# Patient Record
Sex: Male | Born: 1970 | Race: White | Hispanic: No | Marital: Single | Smoking: Current every day smoker
Health system: Southern US, Community
[De-identification: ages and names within clinical notes are randomized; demographics above are authoritative.]

## PROBLEM LIST (undated history)

## (undated) DIAGNOSIS — B20 Human immunodeficiency virus [HIV] disease: Secondary | ICD-10-CM

## (undated) DIAGNOSIS — Z720 Tobacco use: Secondary | ICD-10-CM

## (undated) DIAGNOSIS — F319 Bipolar disorder, unspecified: Secondary | ICD-10-CM

## (undated) DIAGNOSIS — F209 Schizophrenia, unspecified: Secondary | ICD-10-CM

---

## 2018-09-10 ENCOUNTER — Encounter: Payer: Self-pay | Admitting: Intensive Care

## 2018-09-10 ENCOUNTER — Emergency Department
Admission: EM | Admit: 2018-09-10 | Discharge: 2018-09-11 | Disposition: A | Discharge status: Other Acute Inpatient Hospital | Payer: Medicare Other | Attending: Student in an Organized Health Care Education/Training Program | Admitting: Student in an Organized Health Care Education/Training Program

## 2018-09-10 ENCOUNTER — Other Ambulatory Visit: Payer: Self-pay

## 2018-09-10 ENCOUNTER — Emergency Department: Payer: Medicare Other

## 2018-09-10 DIAGNOSIS — Z21 Asymptomatic human immunodeficiency virus [HIV] infection status: Secondary | ICD-10-CM | POA: Insufficient documentation

## 2018-09-10 DIAGNOSIS — Y999 Unspecified external cause status: Secondary | ICD-10-CM | POA: Insufficient documentation

## 2018-09-10 DIAGNOSIS — X58XXXA Exposure to other specified factors, initial encounter: Secondary | ICD-10-CM | POA: Insufficient documentation

## 2018-09-10 DIAGNOSIS — S90414A Abrasion, right lesser toe(s), initial encounter: Secondary | ICD-10-CM | POA: Insufficient documentation

## 2018-09-10 DIAGNOSIS — F17210 Nicotine dependence, cigarettes, uncomplicated: Secondary | ICD-10-CM | POA: Insufficient documentation

## 2018-09-10 DIAGNOSIS — Z046 Encounter for general psychiatric examination, requested by authority: Secondary | ICD-10-CM | POA: Insufficient documentation

## 2018-09-10 DIAGNOSIS — F319 Bipolar disorder, unspecified: Secondary | ICD-10-CM | POA: Insufficient documentation

## 2018-09-10 DIAGNOSIS — Z1159 Encounter for screening for other viral diseases: Secondary | ICD-10-CM | POA: Insufficient documentation

## 2018-09-10 DIAGNOSIS — R45851 Suicidal ideations: Secondary | ICD-10-CM

## 2018-09-10 DIAGNOSIS — Y929 Unspecified place or not applicable: Secondary | ICD-10-CM | POA: Insufficient documentation

## 2018-09-10 DIAGNOSIS — M545 Low back pain: Secondary | ICD-10-CM | POA: Insufficient documentation

## 2018-09-10 DIAGNOSIS — Y9389 Activity, other specified: Secondary | ICD-10-CM | POA: Insufficient documentation

## 2018-09-10 DIAGNOSIS — Z590 Homelessness: Secondary | ICD-10-CM | POA: Insufficient documentation

## 2018-09-10 HISTORY — DX: Tobacco use: Z72.0

## 2018-09-10 HISTORY — DX: Human immunodeficiency virus (HIV) disease: B20

## 2018-09-10 HISTORY — DX: Bipolar disorder, unspecified: F31.9

## 2018-09-10 HISTORY — DX: Schizophrenia, unspecified: F20.9

## 2018-09-10 LAB — COMPREHENSIVE METABOLIC PANEL
ALT: 14 U/L (ref 0–44)
AST: 17 U/L (ref 15–41)
Albumin: 4.3 g/dL (ref 3.5–5.0)
Alkaline Phosphatase: 74 U/L (ref 38–126)
Anion gap: 6 (ref 5–15)
BUN: 17 mg/dL (ref 6–20)
CO2: 26 mmol/L (ref 22–32)
Calcium: 9.6 mg/dL (ref 8.9–10.3)
Chloride: 107 mmol/L (ref 98–111)
Creatinine, Ser: 0.64 mg/dL (ref 0.61–1.24)
GFR calc Af Amer: >60 mL/min (ref >60)
GFR calc non Af Amer: >60 mL/min (ref >60)
Glucose, Bld: 87 mg/dL (ref 70–99)
Potassium: 3.9 mmol/L (ref 3.5–5.1)
Sodium: 139 mmol/L (ref 135–145)
Total Bilirubin: 0.5 mg/dL (ref 0.3–1.2)
Total Protein: 7.5 g/dL (ref 6.5–8.1)

## 2018-09-10 LAB — URINE DRUG SCREEN, QUALITATIVE (ARMC ONLY)
Amphetamines, Ur Screen: NOT DETECTED
Barbiturates, Ur Screen: NOT DETECTED
Benzodiazepine, Ur Scrn: NOT DETECTED
Cannabinoid 50 Ng, Ur ~~LOC~~: NOT DETECTED
Cocaine Metabolite,Ur ~~LOC~~: NOT DETECTED
MDMA (Ecstasy)Ur Screen: NOT DETECTED
Methadone Scn, Ur: NOT DETECTED
Opiate, Ur Screen: NOT DETECTED
Phencyclidine (PCP) Ur S: NOT DETECTED
Tricyclic, Ur Screen: NOT DETECTED

## 2018-09-10 LAB — CBC
HCT: 49.2 % (ref 39.0–52.0)
Hemoglobin: 16 g/dL (ref 13.0–17.0)
MCH: 30.9 pg (ref 26.0–34.0)
MCHC: 32.5 g/dL (ref 30.0–36.0)
MCV: 95 fL (ref 80.0–100.0)
Platelets: 179 10*3/uL (ref 150–400)
RBC: 5.18 MIL/uL (ref 4.22–5.81)
RDW: 12.9 % (ref 11.5–15.5)
WBC: 8.4 10*3/uL (ref 4.0–10.5)
nRBC: 0 % (ref 0.0–0.2)

## 2018-09-10 LAB — ETHANOL: Alcohol, Ethyl (B): <10 mg/dL (ref <10)

## 2018-09-10 LAB — SALICYLATE LEVEL: Salicylate Lvl: <7 mg/dL (ref 2.8–30.0)

## 2018-09-10 LAB — ACETAMINOPHEN LEVEL: Acetaminophen (Tylenol), Serum: <10 ug/mL — ABNORMAL LOW (ref 10–30)

## 2018-09-10 LAB — LITHIUM LEVEL: Lithium Lvl: <0.06 mmol/L — ABNORMAL LOW (ref 0.60–1.20)

## 2018-09-10 NOTE — ED Notes (Signed)
Pt. Sleeping in room at this time.

## 2018-09-10 NOTE — ED Notes (Signed)
Patients thumb nail on his left hand is overgrown and looks infected, his second toe on his right foot has a small abrasion on toe, patients says it causes him pain. Patient is in NAD and is appropriate and cooperative

## 2018-09-10 NOTE — ED Provider Notes (Signed)
[facility] Emergency Department Provider Note    First MD Initiated Contact with Patient 09/10/18 1715     (approximate)  I have reviewed the triage vital signs and the nursing notes.   HISTORY  Chief Complaint Suicidal; Toe Pain; and Back Pain    HPI Miguel Henry is a 48 y.o. male below listed past medical history presents the ER for evaluation of suicidal ideation with plan to slit his wrists.  States he has been feeling this way for the past several days and is thinking about the last time that he tried to overdose on aspirin when he was a child.  States he is also complaining of right second toe pain and low back pain but this is been going on for quite some time.  Denies any fevers.  States he currently is homeless.    Past Medical History:  Diagnosis Date  . Bipolar 1 disorder (HCC)   . HIV (human immunodeficiency virus infection) (HCC)   . Schizophrenic disorder (HCC)   . Tobacco abuse    History reviewed. No pertinent family history. History reviewed. No pertinent surgical history. There are no active problems to display for this patient.     Prior to Admission medications   Not on File    Allergies Patient has no known allergies.    Social History Social History   Tobacco Use  . Smoking status: Current Every Day Smoker    Types: Cigarettes  . Smokeless tobacco: Former Engineer, water Use Topics  . Alcohol use: Yes    Comment: when able to get some  . Drug use: Not Currently    Types: Marijuana, Cocaine    Review of Systems Patient denies headaches, rhinorrhea, blurry vision, numbness, shortness of breath, chest pain, edema, cough, abdominal pain, nausea, vomiting, diarrhea, dysuria, fevers, rashes or hallucinations unless otherwise stated above in HPI. ____________________________________________   PHYSICAL EXAM:  VITAL SIGNS: Vitals:   09/10/18 1624  BP: 110/67  Pulse: 76  Resp: 18  Temp: 99.2 F (37.3 C)   SpO2: 99%    Constitutional: Alert and oriented. disheveled Eyes: Conjunctivae are normal.  Head: Atraumatic. Nose: No congestion/rhinnorhea. Mouth/Throat: Mucous membranes are moist.   Neck: No stridor. Painless ROM.  Cardiovascular: Normal rate, regular rhythm. Grossly normal heart sounds.  Good peripheral circulation. Respiratory: Normal respiratory effort.  No retractions. Lungs CTAB. Gastrointestinal: Soft and nontender. No distention. No abdominal bruits. No CVA tenderness. Genitourinary:  Musculoskeletal:  bunion of right great toe,  No erythema, effusion, cellulitis. No lower extremity tenderness nor edema.  No joint effusions. Neurologic:  Normal speech and language. No gross focal neurologic deficits are appreciated. No facial droop Skin:  Skin is warm, dry and intact. No rash noted. Psychiatric: Mood and affect are anxious appearing. Speech and behavior are normal.  ____________________________________________   LABS (all labs ordered are listed, but only abnormal results are displayed)  Results for orders placed or performed during the hospital encounter of 09/10/18 (from the past 24 hour(s))  cbc     Status: None   Collection Time: 09/10/18  5:17 PM  Result Value Ref Range   WBC 8.4 4.0 - 10.5 K/uL   RBC 5.18 4.22 - 5.81 MIL/uL   Hemoglobin 16.0 13.0 - 17.0 g/dL   HCT 25.0 03.7 - 04.8 %   MCV 95.0 80.0 - 100.0 fL   MCH 30.9 26.0 - 34.0 pg   MCHC 32.5 30.0 - 36.0 g/dL   RDW 88.9 16.9 -  15.5 %   Platelets 179 150 - 400 K/uL   nRBC 0.0 0.0 - 0.2 %   ____________________________________________ ____________________________________________  RADIOLOGY  I personally reviewed all radiographic images ordered to evaluate for the above acute complaints and reviewed radiology reports and findings.  These findings were personally discussed with the patient.  Please see medical record for radiology report.  ____________________________________________   PROCEDURES   Procedure(s) performed:  Procedures    Critical Care performed: no ____________________________________________   INITIAL IMPRESSION / ASSESSMENT AND PLAN / ED COURSE  Pertinent labs & imaging results that were available during my care of the patient were reviewed by me and considered in my medical decision making (see chart for details).   DDX: Psychosis, delirium, medication effect, noncompliance, polysubstance abuse, Si, Hi, depression   Miguel Henry is a 48 y.o. who presents to the ED with for evaluation of SI.  Patient has psych history of schizophrenia.  Laboratory testing was ordered to evaluation for underlying electrolyte derangement or signs of underlying organic pathology to explain today's presentation.  Based on history and physical and laboratory evaluation, it appears that the patient's presentation is 2/2 underlying psychiatric disorder and will require further evaluation and management by inpatient psychiatry.  Patient was  made an IVC due to SI.  Disposition pending psychiatric evaluation.      The patient was evaluated in Emergency Department today for the symptoms described in the history of present illness. He/she was evaluated in the context of the global COVID-19 pandemic, which necessitated consideration that the patient might be at risk for infection with the SARS-CoV-2 virus that causes COVID-19. Institutional protocols and algorithms that pertain to the evaluation of patients at risk for COVID-19 are in a state of rapid change based on information released by regulatory bodies including the CDC and federal and state organizations. These policies and algorithms were followed during the patient's care in the ED.  As part of my medical decision making, I reviewed the following data within the electronic MEDICAL RECORD NUMBER Nursing notes reviewed and incorporated, Labs reviewed, notes from prior ED visits and [place] Controlled Substance Database    ____________________________________________   FINAL CLINICAL IMPRESSION(S) / ED DIAGNOSES  Final diagnoses:  Suicidal ideation      NEW MEDICATIONS STARTED DURING THIS VISIT:  New Prescriptions   No medications on file     Note:  This document was prepared using Dragon voice recognition software and may include unintentional dictation errors.    Willy Eddyobinson, Wilberta Dorvil, MD 09/10/18 1736

## 2018-09-10 NOTE — ED Triage Notes (Signed)
ARrives from [facility] with c/o toe and back pain for "all of his life".  Per EMS  VS wnl.  NAD.  Patient has history of HIV +.

## 2018-09-10 NOTE — ED Notes (Addendum)
RN received return phone call from crisis line. Staff member made aware  patient is in the ER.  Crisis line is the contact number to use over the weekend to notify guardian of patient's disposition ([phone])
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
## 2018-09-10 NOTE — ED Notes (Signed)
Patient Belongings in bag (1/1 bags)  1 pair Gray colored shoes 1 pair white socks 1 pair Blue jeans 1 blue-red striped shirt 1 black belt in jeans 1 pair underwear 1 blue colored lighter (taped up)

## 2018-09-10 NOTE — ED Notes (Addendum)
RN left message for legal guardian Jyl [phone]).  Return phone call requested.   Message also left for crisis line notifying that patient was in the ER [phone]).  Return phone call requested.
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
## 2018-09-10 NOTE — BH Assessment (Signed)
Assessment Note  Miguel Henry is an 48 y.o. male who presents to ED endorsing suicidal ideations. Pt reports "I'm not doing so well ... I'm having thoughts of suicide again". Pt reports he hasn't had these thoughts since he was 48 years old. He reports having thoughts of slitting his wrist or hanging himself. He attempted to overdose on Asprin when he was 48yo. Pt was unable to recall the name of his current group home nor was he oriented to place. Per his MAR sheet that was faxed lists [facility]. Pt also has a legal guardian - [name]: [phone]. Pt also has history of being diagnosed with Bipolar Disorder, Schizoaffective Disorder, and Moderate Cognitive Dysfunction. Pt reports alcohol use "a month ago". He reports he is currently prescribed Haldol injection with his last injection being on 08/09/2018. He was unable to recall the name of his current psychiatrist. He missed his injection that was due 09/08/2018 due to his care home owner not taking him to get it, per his report. While pt was a poor historian, this Clinical research associatewriter also had difficulty understanding patient due to his intellectual deficit. He reports feeling depressed with decreased sleep patterns.       Diagnosis:  Bipolar Disorder, by history Schizoaffective Disorder, by history   Past Medical History:  Past Medical History:  Diagnosis Date  . Bipolar 1 disorder (HCC)   . HIV (human immunodeficiency virus infection) (HCC)   . Schizophrenic disorder (HCC)   . Tobacco abuse     History reviewed. No pertinent surgical history.  Family History: History reviewed. No pertinent family history.  Social History:  reports that he has been smoking cigarettes. He has quit using smokeless tobacco. He reports current alcohol use. He reports previous drug use. Drugs: Marijuana and Cocaine.  Additional Social History:  Alcohol / Drug Use Pain Medications: See MAR Prescriptions: See MAR Over the Counter: See MAR History  of alcohol / drug use?: Yes Longest period of sobriety (when/how long): [facility] Negative Consequences of Use: (None Reported) Withdrawal Symptoms: (None Reported) Substance #1 Name of Substance 1: Alcohol 1 - Age of First Use: Pt unable to quantify 1 - Amount (size/oz): Pt unable to quantify 1 - Frequency: Pt unable to quantify 1 - Duration: Pt unable to quantify 1 - Last Use / Amount: "a month ago"  CIWA: CIWA-Ar BP: 110/67 Pulse Rate: 76 COWS:    Allergies: No Known Allergies  Home Medications: (Not in a hospital admission)   OB/GYN Status:  No LMP for male patient.  General Assessment Data [facility] Assessment: In system Is this a Tele or Face-to-Face Assessment?: Face-to-Face Is this an Initial Assessment or a Re-assessment for this encounter?: Initial Assessment Patient Accompanied by:: N/A Language Other than English: No Living Arrangements: In Group Home: (Comment: Name of Group Home)([facility]) What gender do you identify as?: Male Marital status: Single Maiden name: N/A Pregnancy Status: No Living Arrangements: Group Home Can pt return to current living arrangement?: Yes Admission Status: Involuntary Petitioner: Police Is patient capable of signing voluntary admission?: No Referral Source: Self/Family/Friend Insurance type: Medicare  Medical Screening Exam [facility] Walk-in ONLY) Medical Exam completed: Yes  Crisis Care Plan Living Arrangements: Group Home Legal Guardian: Other:([name] - [phone]) Name of Psychiatrist: [facility] - Pt unable to recall the name of psychiatrist Name of Therapist: [facility]  Education Status Is patient currently in school?: No Is the patient employed, unemployed or receiving disability?: Receiving disability income  Risk  to self with the past 6 months Suicidal Ideation: Yes-Currently Present Has patient been a risk to self within the past 6 months prior to admission? : Yes Suicidal Intent: Yes-Currently  Present Has patient had any suicidal intent within the past 6 months prior to admission? : Yes Is patient at risk for suicide?: Yes Suicidal Plan?: Yes-Currently Present Has patient had any suicidal plan within the past 6 months prior to admission? : Yes Specify Current Suicidal Plan: Pt having thoughts to slit his wrist Access to Means: Yes Specify Access to Suicidal Means: Access to sharp objects What has been your use of drugs/alcohol within the last 12 months?: Alcohol Previous Attempts/Gestures: Yes How many times?: 1 Other Self Harm Risks: None Triggers for Past Attempts: None known Intentional Self Injurious Behavior: None Family Suicide History: Unknown Recent stressful life event(s): (None Reported) Persecutory voices/beliefs?: No Depression: Yes Depression Symptoms: Feeling worthless/self pity, Insomnia Substance abuse history and/or treatment for substance abuse?: No Suicide prevention information given to non-admitted patients: Not applicable  Risk to Others within the past 6 months Homicidal Ideation: No Does patient have any lifetime risk of violence toward others beyond the six months prior to admission? : No Thoughts of Harm to Others: No Current Homicidal Intent: No Current Homicidal Plan: No Access to Homicidal Means: No Identified Victim: None History of harm to others?: No Assessment of Violence: None Noted Violent Behavior Description: None Does patient have access to weapons?: No Criminal Charges Pending?: No Does patient have a court date: No Is patient on probation?: No  Psychosis Hallucinations: None noted Delusions: None noted  Mental Status Report Appearance/Hygiene: In scrubs Eye Contact: Good Motor Activity: Freedom of movement Speech: Slurred, Other (Comment)(difficult to understand) Level of Consciousness: Alert Mood: Depressed Affect: Depressed Anxiety Level: Minimal Thought Processes: Coherent Judgement: Unable to  Assess Orientation: Time, Person, Situation Obsessive Compulsive Thoughts/Behaviors: None  Cognitive Functioning Concentration: Normal Memory: Recent Impaired, Remote Impaired Is patient IDD: Yes Level of Function: Moderate Is IQ score available?: No Insight: see judgement above Impulse Control: Fair Appetite: Good Have you had any weight changes? : No Change Sleep: Decreased Total Hours of Sleep: 2 Vegetative Symptoms: None  ADLScreening [facility]) Patient's cognitive ability adequate to safely complete daily activities?: Yes Patient able to express need for assistance with ADLs?: Yes Independently performs ADLs?: Yes (appropriate for developmental age)  Prior Inpatient Therapy Prior Inpatient Therapy: No([facility])  Prior Outpatient Therapy Prior Outpatient Therapy: No([facility]) Does patient have an ACCT team?: No Does patient have Intensive In-House Services?  : No Does patient have [facility] services? : No Does patient have P4CC services?: No  ADL Screening (condition at time of admission) Patient's cognitive ability adequate to safely complete daily activities?: Yes Patient able to express need for assistance with ADLs?: Yes Independently performs ADLs?: Yes (appropriate for developmental age)       Abuse/Neglect Assessment (Assessment to be complete while patient is alone) Abuse/Neglect Assessment Can Be Completed: Yes Physical Abuse: Denies Verbal Abuse: Denies Sexual Abuse: Denies Exploitation of patient/patient's resources: Denies Self-Neglect: Denies Values / Beliefs Cultural Requests During Hospitalization: None Spiritual Requests During Hospitalization: None Consults Spiritual Care Consult Needed: No Social Work Consult Needed: No Merchant navy officer (For Healthcare) Does Patient Have a Medical Advance Directive?: No Would patient like information on creating a medical advance directive?: No - Patient declined       Child/Adolescent  Assessment Running Away Risk: (Patient is an adult)  Disposition:  Disposition Initial Assessment Completed for this  Encounter: Yes Disposition of Patient: Admit Type of inpatient treatment program: Adult Patient refused recommended treatment: No Mode of transportation if patient is discharged/movement?: Car Patient referred to: Other (Comment)([facility] [employer])  On Site Evaluation by:   Reviewed with Physician:    Wilmon Arms 09/10/2018 8:02 PM

## 2018-09-10 NOTE — ED Notes (Signed)
IVC/Consult ordered ?

## 2018-09-11 ENCOUNTER — Inpatient Hospital Stay
Admission: AD | Admit: 2018-09-11 | Discharge: 2018-09-14 | DRG: 885 | Disposition: A | Discharge status: Home / Self Care | Payer: Medicare Other | Attending: Psychiatry | Admitting: Psychiatry

## 2018-09-11 ENCOUNTER — Other Ambulatory Visit: Payer: Self-pay

## 2018-09-11 DIAGNOSIS — F250 Schizoaffective disorder, bipolar type: Principal | ICD-10-CM | POA: Diagnosis present

## 2018-09-11 DIAGNOSIS — R45851 Suicidal ideations: Secondary | ICD-10-CM | POA: Diagnosis present

## 2018-09-11 DIAGNOSIS — Z21 Asymptomatic human immunodeficiency virus [HIV] infection status: Secondary | ICD-10-CM | POA: Diagnosis present

## 2018-09-11 DIAGNOSIS — Z1159 Encounter for screening for other viral diseases: Secondary | ICD-10-CM | POA: Diagnosis not present

## 2018-09-11 DIAGNOSIS — F259 Schizoaffective disorder, unspecified: Secondary | ICD-10-CM | POA: Diagnosis present

## 2018-09-11 DIAGNOSIS — F17210 Nicotine dependence, cigarettes, uncomplicated: Secondary | ICD-10-CM | POA: Diagnosis present

## 2018-09-11 DIAGNOSIS — F71 Moderate intellectual disabilities: Secondary | ICD-10-CM

## 2018-09-11 DIAGNOSIS — F251 Schizoaffective disorder, depressive type: Secondary | ICD-10-CM | POA: Diagnosis not present

## 2018-09-11 LAB — SARS CORONAVIRUS 2 BY RT PCR (HOSPITAL ORDER, PERFORMED IN ~~LOC~~ HOSPITAL LAB): SARS Coronavirus 2: NEGATIVE

## 2018-09-11 MED ORDER — LORATADINE 10 MG PO TABS
10.0000 mg | ORAL_TABLET | Freq: Every day | ORAL | Status: DC
  Administered 2018-09-11 – 2018-09-14 (×4): 10 mg via ORAL
  Filled 2018-09-11 (×4): qty 1

## 2018-09-11 MED ORDER — TRAZODONE HCL 50 MG PO TABS
50.0000 mg | ORAL_TABLET | Freq: Every evening | ORAL | Status: DC | PRN
  Administered 2018-09-11: 50 mg via ORAL
  Filled 2018-09-11: qty 1

## 2018-09-11 MED ORDER — ALUM & MAG HYDROXIDE-SIMETH 200-200-20 MG/5ML PO SUSP: 30.0000 mL | ORAL | Status: DC | PRN

## 2018-09-11 MED ORDER — HYDROXYZINE HCL 25 MG PO TABS
25.0000 mg | ORAL_TABLET | Freq: Three times a day (TID) | ORAL | Status: DC | PRN
  Administered 2018-09-11: 25 mg via ORAL
  Filled 2018-09-11: qty 1

## 2018-09-11 MED ORDER — MELATONIN 3 MG PO TABS: 6.0000 mg | ORAL_TABLET | Freq: Every day | ORAL | Status: DC

## 2018-09-11 MED ORDER — ACETAMINOPHEN 325 MG PO TABS: 650.0000 mg | ORAL_TABLET | Freq: Four times a day (QID) | ORAL | Status: DC | PRN

## 2018-09-11 MED ORDER — OXCARBAZEPINE 300 MG PO TABS
300.0000 mg | ORAL_TABLET | Freq: Two times a day (BID) | ORAL | Status: DC
  Administered 2018-09-11 – 2018-09-14 (×6): 300 mg via ORAL
  Filled 2018-09-11 (×6): qty 1

## 2018-09-11 MED ORDER — HALOPERIDOL 1 MG PO TABS
2.0000 mg | ORAL_TABLET | Freq: Two times a day (BID) | ORAL | Status: DC
  Administered 2018-09-11 – 2018-09-14 (×6): 2 mg via ORAL
  Filled 2018-09-11 (×6): qty 2

## 2018-09-11 MED ORDER — QUETIAPINE FUMARATE 25 MG PO TABS: 50.0000 mg | ORAL_TABLET | Freq: Every evening | ORAL | Status: DC | PRN

## 2018-09-11 MED ORDER — MAGNESIUM HYDROXIDE 400 MG/5ML PO SUSP: 30.0000 mL | Freq: Every day | ORAL | Status: DC | PRN

## 2018-09-11 MED ORDER — LITHIUM CARBONATE ER 450 MG PO TBCR: 450.0000 mg | EXTENDED_RELEASE_TABLET | Freq: Two times a day (BID) | ORAL | Status: DC

## 2018-09-11 MED ORDER — MELATONIN 5 MG PO TABS
5.0000 mg | ORAL_TABLET | Freq: Every day | ORAL | Status: DC
  Administered 2018-09-11 – 2018-09-13 (×3): 5 mg via ORAL
  Filled 2018-09-11 (×5): qty 1

## 2018-09-11 NOTE — ED Notes (Signed)
He has ambulated to and from the BR while using a walker  NAD observed  Continue to monitor

## 2018-09-11 NOTE — ED Notes (Signed)
BEHAVIORAL HEALTH ROUNDING Patient sleeping: No. Patient alert and oriented: yes Behavior appropriate: Yes.  ; If no, describe:  Nutrition and fluids offered: yes Toileting and hygiene offered: Yes  Sitter present: q15 minute observations and security  monitoring Law enforcement present: Yes  ODS  

## 2018-09-11 NOTE — Progress Notes (Signed)
Addendum to Psych Consult Note:  Lithium level 0.06 - patient was not taking the medication at home. Will not restart for now. Will defer to primary team.

## 2018-09-11 NOTE — ED Notes (Addendum)
meds updated upon arrival by pharmacy  - verified

## 2018-09-11 NOTE — ED Notes (Signed)

## 2018-09-11 NOTE — Tx Team (Signed)
Initial Treatment Plan 09/11/2018 1:35 PM [name] [id number]    PATIENT STRESSORS: Educational concerns Financial difficulties Health problems Occupational concerns   PATIENT STRENGTHS: Active sense of humor Communication skills   PATIENT IDENTIFIED PROBLEMS: Suicidal Ideations  Ineffective coping skills                   DISCHARGE CRITERIA:  Adequate post-discharge living arrangements Medical problems require only outpatient monitoring Verbal commitment to aftercare and medication compliance  PRELIMINARY DISCHARGE PLAN: Attend aftercare/continuing care group Placement in alternative living arrangements  PATIENT/FAMILY INVOLVEMENT: This treatment plan has been presented to and reviewed with the patient, Miguel Henry.  The patient and family have been given the opportunity to ask questions and make suggestions.  Berkley Harvey, RN 09/11/2018, 1:35 PM

## 2018-09-11 NOTE — Progress Notes (Signed)
Patient is sad and depressed but   cooperative during admission assessment.Patient's speech is slurred and hard to understand. Patient verbalized passive SI,contracts for safety. Patient denies HI and AVH at this time. Patient informed of fall risk status, fall risk assessed "moderate" at this time.Patient uses walker for ambulation. Patient oriented to unit/staff/room. Patient denies any questions/concerns at this time. Patient safe on unit with Q15 minute checks for safety. Skin assessment and body search done,no contraband found.

## 2018-09-11 NOTE — ED Notes (Signed)
Psychiatry is currently consulting with him 

## 2018-09-11 NOTE — BH Assessment (Signed)
Patient is to be admitted to [facility] by Dr. Lenon Ahmadi.  Attending Physician will be Dr. Toni Amend.   Patient has been assigned to room 319, by [facility] Charge Nurse Lorenda Hatchet.   Intake Paper Work has been signed and placed on patient chart.   ER staff is aware of the admission:  Ronnie, ER Secretary    Dr. Don Perking, ER MD   Amy T., Patient's Nurse   Dondra Prader, Patient Access.

## 2018-09-11 NOTE — ED Notes (Addendum)
Assessment completed   SOC completed  Pt to be admitted to inpatient psychiatry

## 2018-09-11 NOTE — ED Provider Notes (Signed)
-----------------------------------------   5:24 AM on 09/11/2018 -----------------------------------------   Blood pressure 110/67, pulse 76, temperature 99.2 F (37.3 C), temperature source Oral, resp. rate 18, height 5' 7.5" (1.715 m), weight 72.6 kg, SpO2 99 %.  The patient is calm and cooperative at this time.  There have been no acute events since the last update.  Awaiting disposition plan from Behavioral Medicine team.    Jeanmarie Plant, MD 09/11/18 [phone]

## 2018-09-11 NOTE — ED Notes (Signed)
Pt observed lying in bed - watching TV   Pt visualized with NAD  No verbalized needs or concerns at this time  Continue to monitor 

## 2018-09-11 NOTE — ED Notes (Signed)
ED  Is the patient under IVC or is there intent for IVC: Yes.   Is the patient medically cleared: Yes.   Is there vacancy in the ED BHU:   Unit closed Is the population mix appropriate for patient: Yes.   Is the patient awaiting placement in inpatient or outpatient setting: Yes.   Has the patient had a psychiatric consult: Yes.   Survey of unit performed for contraband, proper placement and condition of furniture, tampering with fixtures in bathroom, shower, and each patient room: Yes.  ; Findings:  APPEARANCE/BEHAVIOR Calm and cooperative NEURO ASSESSMENT Orientation: oriented x3   Hallucinations: No.None noted (Hallucinations)  denies Speech: Normal Gait: he uses a walker  RESPIRATORY ASSESSMENT Even  Unlabored respirations  CARDIOVASCULAR ASSESSMENT Pulses equal   regular rate  Skin warm and dry   GASTROINTESTINAL ASSESSMENT no GI complaint EXTREMITIES Full ROM  PLAN OF CARE Provide calm/safe environment. Vital signs assessed twice daily. ED BHU Assessment once each 12-hour shift. Collaborate with TTS if available or as condition indicates. Assure the ED provider has rounded once each shift. Provide and encourage hygiene. Provide redirection as needed. Assess for escalating behavior; address immediately and inform ED provider.  Assess family dynamic and appropriateness for visitation as needed: Yes.  ; If necessary, describe findings:  Educate the patient/family about BHU procedures/visitation: Yes.  ; If necessary, describe findings:
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
## 2018-09-12 DIAGNOSIS — F251 Schizoaffective disorder, depressive type: Secondary | ICD-10-CM

## 2018-09-12 NOTE — Progress Notes (Signed)
D: Pt continues to be very flat and depressed on the unit today. Pt also continues to be very isolative, but was able to come out for his medication. Pt unsteady on his feet and uses walker to ambulate. Pt reported being negative SI/HI, no AH/VH noted. A: 15 min checks continued for patient safety. R: Pts safety maintained.

## 2018-09-12 NOTE — [facility] Counselor (Signed)
CSW called patient's legal guardian Jyl Heinz: [phone], no answer CSW left a message requesting a call back for additional information about the patient.    Johnnye Sima, LCSW  09/12/2018

## 2018-09-12 NOTE — Progress Notes (Signed)
[place] NOVEL CORONAVIRUS (COVID-19) DAILY CHECK-OFF SYMPTOMS - answer yes or no to each - every day NO YES  Have you had a fever in the past 24 hours?  . Fever (Temp > 37.80C / 100F) X   Have you had any of these symptoms in the past 24 hours? . New Cough .  Sore Throat  .  Shortness of Breath .  Difficulty Breathing .  Unexplained Body Aches   X   Have you had any one of these symptoms in the past 24 hours not related to allergies?   . Runny Nose .  Nasal Congestion .  Sneezing   X   If you have had runny nose, nasal congestion, sneezing in the past 24 hours, has it worsened?  X   EXPOSURES - check yes or no X   Have you traveled outside the state in the past 14 days?  X   Have you been in contact with someone with a confirmed diagnosis of COVID-19 or PUI in the past 14 days without wearing appropriate PPE?  X   Have you been living in the same home as a person with confirmed diagnosis of COVID-19 or a PUI (household contact)?    X   Have you been diagnosed with COVID-19?    X              What to do next: Answered NO to all: Answered YES to anything:   Proceed with unit schedule Follow the [facility] Inpatient Flowsheet.   
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
## 2018-09-12 NOTE — Progress Notes (Signed)
D. Pt friendly upon approach-speech somewhat garbled- is calm and cooperative- but somewhat guarded- isolating to room for much of the morning. Pt currently denies SI/HI and AVH A. Labs and vitals monitored. Pt compliant with medications. Pt supported emotionally and encouraged to express concerns and ask questions.   R. Pt remains safe with 15 minute checks. Will continue POC.

## 2018-09-12 NOTE — [facility] Suicide Risk Assessment (Signed)
[facility] INPATIENT:  Family/Significant Other Suicide Prevention Education  Suicide Prevention Education:  Patient Refusal for Family/Significant Other Suicide Prevention Education: The patient Miguel Henry has refused to provide written consent for family/significant other to be provided Family/Significant Other Suicide Prevention Education during admission and/or prior to discharge.  Physician notified.  Christana Angelica  CUEBAS-COLON 09/12/2018, 4:13 PM

## 2018-09-12 NOTE — [facility] Group Notes (Signed)
LCSW Group Therapy Note 09/12/2018 1:15pm  Type of Therapy and Topic: Group Therapy: Feelings Around Returning Home & Establishing a Supportive Framework and Supporting Oneself When Supports Not Available  Participation Level: Active  Description of Group:  Patients first processed thoughts and feelings about upcoming discharge. These included fears of upcoming changes, lack of change, new living environments, judgements and expectations from others and overall stigma of mental health issues. The group then discussed the definition of a supportive framework, what that looks and feels like, and how do to discern it from an unhealthy non-supportive network. The group identified different types of supports as well as what to do when your family/friends are less than helpful or unavailable  Therapeutic Goals  1. Patient will identify one healthy supportive network that they can use at discharge. 2. Patient will identify one factor of a supportive framework and how to tell it from an unhealthy network. 3. Patient able to identify one coping skill to use when they do not have positive supports from others. 4. Patient will demonstrate ability to communicate their needs through discussion and/or role plays.  Summary of Patient Progress:  The patient reported he feels "bamboozled." Pt engaged during group session. As patients processed their anxiety about discharge and described healthy supports patient shared he is ready to be discharge.  Patients identified at least one self-care tool they were willing to use after discharge.   Therapeutic Modalities Cognitive Behavioral Therapy Motivational Interviewing   Miguel Henry  CUEBAS-COLON, LCSW 09/12/2018 12:02 PM

## 2018-09-12 NOTE — [facility] Suicide Risk Assessment (Signed)
[facility] Admission Suicide Risk Assessment   Nursing information obtained from:  Patient Demographic factors:  Male Current Mental Status:  Suicidal ideation indicated by patient Loss Factors:  Loss of significant relationship Historical Factors:  Prior suicide attempts, Impulsivity Risk Reduction Factors:  NA  Total Time spent with patient: 45 minutes Principal Problem: <principal problem not specified> Diagnosis:  Active Problems:   Schizoaffective disorder (HCC)  Subjective Data: Patient is a 48 yo male with history of depression  And suicidal thoughts  Continued Clinical Symptoms:  Alcohol Use Disorder Identification Test Final Score (AUDIT): 1 The "Alcohol Use Disorders Identification Test", Guidelines for Use in Primary Care, Second Edition.  World Science writer [facility]). Score between 0-7:  no or low risk or alcohol related problems. Score between 8-15:  moderate risk of alcohol related problems. Score between 16-19:  high risk of alcohol related problems. Score 20 or above:  warrants further diagnostic evaluation for alcohol dependence and treatment.   CLINICAL FACTORS:   Depression:   Hopelessness Impulsivity Severe   Musculoskeletal: Strength & Muscle Tone: within normal limits Gait & Station: normal Patient leans: N/A  Psychiatric Specialty Exam: Physical Exam  ROS  Blood pressure (!) 96/57, pulse (!) 54, temperature 97.6 F (36.4 C), temperature source Oral, resp. rate 18, height 5' 6.93" (1.7 m), weight 66.2 kg, SpO2 95 %.Body mass index is 22.92 kg/m.   General Appearance: Casual  Eye Contact:  Fair  Speech:  Garbled  Volume:  Decreased  Mood:  Depressed and Dysphoric  Affect:  Constricted  Thought Process:  Coherent  Orientation:  Full (Time, Place, and Person)  Thought Content:  Logical  Suicidal Thoughts:  Yes.  with intent/plan  Homicidal Thoughts:  No  Memory:  Immediate;   Poor Recent;   Poor Remote;   Poor  Judgement:  Impaired  Insight:   Lacking  Psychomotor Activity:  Decreased  Concentration:  Concentration: Fair and Attention Span: Fair  Recall:  [employer] of Knowledge:  Poor  Language:  Fair  Akathisia:  No  Handed:  Right  AIMS (if indicated):     Assets:  Desire for Improvement  ADL's:  Intact  Cognition:  Impaired,  Mild  Sleep:  Number of Hours: 7.45    Treatment Plan Summary: Daily contact with patient to assess and evaluate symptoms and progress in treatment and Medication management  Observation Level/Precautions:  15 minute checks  Laboratory: Labs all within normal limits including comprehensive metabolic panel, alcohol level less than 10, urine drug screen negative. Lithium level subtherapeutic at 0.06 Covid test negative.  Psychotherapy: As needed  Medications:  Will continue for now Haldol 2mg  PO BID, Lithium 450mg  PO BID, Melatonin 6mg  PO QHS, Trileptal 300mg  PO BID. Seroquel 50mg  PO QHS PRN agitation. -Li level order placed  Consultations:  As needed  Discharge Concerns: Safety and stabilization  Estimated LOS: 5 days  Other:     Physician Treatment Plan for Primary Diagnosis: <principal problem not specified> Long Term Goal(s): Improvement in symptoms so as ready for discharge  Short Term Goals: Ability to identify changes in lifestyle to reduce recurrence of condition will improve, Ability to verbalize feelings will improve, Ability to disclose and discuss suicidal ideas, Ability to demonstrate self-control will improve, Ability to identify and develop effective coping behaviors will improve, Ability to maintain clinical measurements within normal limits will improve and Compliance with prescribed medications will improve  Physician Treatment Plan for Secondary Diagnosis: Active Problems:   Schizoaffective disorder (HCC)  Long Term Goal(s): Improvement in symptoms so as ready for discharge  Short Term Goals: Ability to identify changes in lifestyle to reduce recurrence of condition  will improve, Ability to verbalize feelings will improve, Ability to disclose and discuss suicidal ideas, Ability to demonstrate self-control will improve, Ability to identify and develop effective coping behaviors will improve, Ability to maintain clinical measurements within normal limits will improve and Compliance with prescribed medications will improve  I certify that inpatient services furnished can reasonably be expected to improve the patient's condition.    COGNITIVE FEATURES THAT CONTRIBUTE TO RISK:  Thought constriction (tunnel vision)    SUICIDE RISK:   Moderate:  Frequent suicidal ideation with limited intensity, and duration, some specificity in terms of plans, no associated intent, good self-control, limited dysphoria/symptomatology, some risk factors present, and identifiable protective factors, including available and accessible social support.  PLAN OF CARE: see above  I certify that inpatient services furnished can reasonably be expected to improve the patient's condition.   Patrick NorthHimabindu Winfield Caba, MD 09/12/2018, 11:15 AM
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
## 2018-09-13 DIAGNOSIS — F251 Schizoaffective disorder, depressive type: Secondary | ICD-10-CM

## 2018-09-13 DIAGNOSIS — F71 Moderate intellectual disabilities: Secondary | ICD-10-CM

## 2018-09-13 MED ORDER — MELATONIN 5 MG PO TABS: 5.0000 mg | ORAL_TABLET | Freq: Every day | ORAL | 1 refills | Status: DC

## 2018-09-13 MED ORDER — LORATADINE 10 MG PO TABS: 10.0000 mg | ORAL_TABLET | Freq: Every day | ORAL | 1 refills | Status: DC

## 2018-09-13 MED ORDER — TRAZODONE HCL 50 MG PO TABS: 50.0000 mg | ORAL_TABLET | Freq: Every evening | ORAL | 1 refills | Status: DC | PRN

## 2018-09-13 MED ORDER — QUETIAPINE FUMARATE 25 MG PO TABS: 50.0000 mg | ORAL_TABLET | Freq: Every evening | ORAL | 1 refills | Status: DC | PRN

## 2018-09-13 MED ORDER — HALOPERIDOL DECANOATE 100 MG/ML IM SOLN: 50.0000 mg | INTRAMUSCULAR | 1 refills | Status: DC

## 2018-09-13 MED ORDER — HALOPERIDOL 2 MG PO TABS: 2.0000 mg | ORAL_TABLET | Freq: Two times a day (BID) | ORAL | 1 refills | Status: DC

## 2018-09-13 MED ORDER — LITHIUM CARBONATE ER 450 MG PO TBCR
450.0000 mg | EXTENDED_RELEASE_TABLET | Freq: Two times a day (BID) | ORAL | Status: DC
  Administered 2018-09-13 – 2018-09-14 (×2): 450 mg via ORAL
  Filled 2018-09-13 (×2): qty 1

## 2018-09-13 MED ORDER — LITHIUM CARBONATE ER 450 MG PO TBCR: 450.0000 mg | EXTENDED_RELEASE_TABLET | Freq: Two times a day (BID) | ORAL | 1 refills | Status: DC

## 2018-09-13 MED ORDER — OXCARBAZEPINE 300 MG PO TABS: 300.0000 mg | ORAL_TABLET | Freq: Two times a day (BID) | ORAL | 1 refills | Status: DC

## 2018-09-13 MED ORDER — HALOPERIDOL DECANOATE 100 MG/ML IM SOLN
50.0000 mg | INTRAMUSCULAR | Status: DC
  Administered 2018-09-13: 50 mg via INTRAMUSCULAR
  Filled 2018-09-13: qty 0.5

## 2018-09-13 NOTE — Progress Notes (Signed)
Recreation Therapy Notes   Date: 09/13/2018  Time: 9:30 am   Location: Craft room   Behavioral response: N/A   Intervention Topic: Coping skills  Discussion/Intervention: Patient did not attend group.   Clinical Observations/Feedback:  Patient did not attend group.   Sargun Rummell LRT/CTRS        Alajah Witman 09/13/2018 11:00 AM 

## 2018-09-13 NOTE — Plan of Care (Signed)
  Problem: Safety: Goal: Periods of time without injury will increase Outcome: Progressing  Patient appears free of injury on the unit

## 2018-09-13 NOTE — Tx Team (Addendum)
Interdisciplinary Treatment and Diagnostic Plan Update  09/13/2018 Time of Session: 900AM Miguel Henry MRN: [id number]  Principal Diagnosis: <principal problem not specified>  Secondary Diagnoses: Active Problems:   Schizoaffective disorder (HCC)   Current Medications:  Current Facility-Administered Medications  Medication Dose Route Frequency Provider Last Rate Last Dose  . acetaminophen (TYLENOL) tablet 650 mg  650 mg Oral Q6H PRN Thalia Party, MD      . alum & mag hydroxide-simeth (MAALOX/MYLANTA) 200-200-20 MG/5ML suspension 30 mL  30 mL Oral Q4H PRN Paliy, Alisa, MD      . haloperidol (HALDOL) tablet 2 mg  2 mg Oral BID Thalia Party, MD   2 mg at 09/13/18 0916  . hydrOXYzine (ATARAX/VISTARIL) tablet 25 mg  25 mg Oral TID PRN Thalia Party, MD   25 mg at 09/11/18 2157  . loratadine (CLARITIN) tablet 10 mg  10 mg Oral Daily Thalia Party, MD   10 mg at 09/13/18 0916  . magnesium hydroxide (MILK OF MAGNESIA) suspension 30 mL  30 mL Oral Daily PRN Thalia Party, MD      . Melatonin TABS 5 mg  5 mg Oral QHS Thalia Party, MD   5 mg at 09/12/18 2202  . Oxcarbazepine (TRILEPTAL) tablet 300 mg  300 mg Oral BID Thalia Party, MD   300 mg at 09/13/18 0916  . QUEtiapine (SEROQUEL) tablet 50 mg  50 mg Oral QHS PRN Thalia Party, MD      . traZODone (DESYREL) tablet 50 mg  50 mg Oral QHS PRN Thalia Party, MD   50 mg at 09/11/18 2157   PTA Medications: Medications Prior to Admission  Medication Sig Dispense Refill Last Dose  . cetirizine (ZYRTEC) 10 MG tablet Take 10 mg by mouth daily.   Past Week at Unknown time  . haloperidol (HALDOL) 2 MG tablet Take 2 mg by mouth 2 (two) times daily.   Past Week at Unknown time  . haloperidol decanoate (HALDOL DECANOATE) 50 MG/ML injection Inject 50 mg into the muscle every 28 (twenty-eight) days.   Past Month at Unknown time  . lithium carbonate (ESKALITH) 450 MG CR tablet Take 450 mg by mouth 2 (two) times daily.   Past Week at Unknown time  . Melatonin 3  MG TABS Take 6 mg by mouth at bedtime.   09/09/2018 at Unknown time  . Oxcarbazepine (TRILEPTAL) 300 MG tablet Take 300 mg by mouth 2 (two) times daily.   Past Week at Unknown time  . PARoxetine (PAXIL) 10 MG tablet Take 10 mg by mouth daily.   Past Week at Unknown time  . QUEtiapine (SEROQUEL) 25 MG tablet Take 25 mg by mouth at bedtime.   09/09/2018 at unknown  . QUEtiapine (SEROQUEL) 25 MG tablet Take 50 mg by mouth at bedtime as needed (agitation).   Past Week at Unknown time    Patient Stressors: Educational concerns Financial difficulties Health problems Occupational concerns  Patient Strengths: Active sense of humor Communication skills  Treatment Modalities: Medication Management, Group therapy, Case management,  1 to 1 session with clinician, Psychoeducation, Recreational therapy.   Physician Treatment Plan for Primary Diagnosis: <principal problem not specified> Long Term Goal(s): Improvement in symptoms so as ready for discharge Improvement in symptoms so as ready for discharge   Short Term Goals: Ability to identify changes in lifestyle to reduce recurrence of condition will improve Ability to verbalize feelings will improve Ability to disclose and discuss suicidal ideas Ability to demonstrate self-control will improve Ability to identify and  develop effective coping behaviors will improve Ability to maintain clinical measurements within normal limits will improve Compliance with prescribed medications will improve Ability to identify changes in lifestyle to reduce recurrence of condition will improve Ability to verbalize feelings will improve Ability to disclose and discuss suicidal ideas Ability to demonstrate self-control will improve Ability to identify and develop effective coping behaviors will improve Ability to maintain clinical measurements within normal limits will improve Compliance with prescribed medications will improve  Medication Management: Evaluate  patient's response, side effects, and tolerance of medication regimen.  Therapeutic Interventions: 1 to 1 sessions, Unit Group sessions and Medication administration.  Evaluation of Outcomes: Progressing  Physician Treatment Plan for Secondary Diagnosis: Active Problems:   Schizoaffective disorder (HCC)  Long Term Goal(s): Improvement in symptoms so as ready for discharge Improvement in symptoms so as ready for discharge   Short Term Goals: Ability to identify changes in lifestyle to reduce recurrence of condition will improve Ability to verbalize feelings will improve Ability to disclose and discuss suicidal ideas Ability to demonstrate self-control will improve Ability to identify and develop effective coping behaviors will improve Ability to maintain clinical measurements within normal limits will improve Compliance with prescribed medications will improve Ability to identify changes in lifestyle to reduce recurrence of condition will improve Ability to verbalize feelings will improve Ability to disclose and discuss suicidal ideas Ability to demonstrate self-control will improve Ability to identify and develop effective coping behaviors will improve Ability to maintain clinical measurements within normal limits will improve Compliance with prescribed medications will improve     Medication Management: Evaluate patient's response, side effects, and tolerance of medication regimen.  Therapeutic Interventions: 1 to 1 sessions, Unit Group sessions and Medication administration.  Evaluation of Outcomes: Progressing   RN Treatment Plan for Primary Diagnosis: <principal problem not specified> Long Term Goal(s): Knowledge of disease and therapeutic regimen to maintain health will improve  Short Term Goals: Ability to remain free from injury will improve, Ability to participate in decision making will improve, Ability to verbalize feelings will improve, Ability to disclose and discuss  suicidal ideas and Compliance with prescribed medications will improve  Medication Management: RN will administer medications as ordered by provider, will assess and evaluate patient's response and provide education to patient for prescribed medication. RN will report any adverse and/or side effects to prescribing provider.  Therapeutic Interventions: 1 on 1 counseling sessions, Psychoeducation, Medication administration, Evaluate responses to treatment, Monitor vital signs and CBGs as ordered, Perform/monitor CIWA, COWS, AIMS and Fall Risk screenings as ordered, Perform wound care treatments as ordered.  Evaluation of Outcomes: Progressing   LCSW Treatment Plan for Primary Diagnosis: <principal problem not specified> Long Term Goal(s): Safe transition to appropriate next level of care at discharge, Engage patient in therapeutic group addressing interpersonal concerns.  Short Term Goals: Engage patient in aftercare planning with referrals and resources, Increase ability to appropriately verbalize feelings, Increase emotional regulation, Facilitate acceptance of mental health diagnosis and concerns and Increase skills for wellness and recovery  Therapeutic Interventions: Assess for all discharge needs, 1 to 1 time with Social worker, Explore available resources and support systems, Assess for adequacy in community support network, Educate family and significant other(s) on suicide prevention, Complete Psychosocial Assessment, Interpersonal group therapy.  Evaluation of Outcomes: Progressing   Progress in Treatment: Attending groups: Yes. Participating in groups: Yes. Taking medication as prescribed: Yes. Toleration medication: Yes. Family/Significant other contact made: No, will contact:  Pts legal guardian [name] Patient understands diagnosis: Yes.  Discussing patient identified problems/goals with staff: Yes. Medical problems stabilized or resolved: Yes. Denies suicidal/homicidal  ideation: Yes. Issues/concerns per patient self-inventory: No. Other: N/A  New problem(s) identified: No, Describe:  none  New Short Term/Long Term Goal(s):  medication management for mood stabilization; elimination of SI thoughts; development of comprehensive mental wellness/sobriety plan.   Patient Goals:  "To get out of here"  Discharge Plan or Barriers: SPE pamphlet, Mobile Crisis information, and AA/NA information provided to patient for additional community support and resources. CSW assessing for appropriate referrals.  Reason for Continuation of Hospitalization: Depression Medication stabilization  Estimated Length of Stay: 5-7 days  Recreational Therapy: Patient Stressors: N/A Patient Goal: Patient will engage in groups without prompting or encouragement from LRT x3 group sessions within 5 recreation therapy group sessions  Attendees: Patient: Federico Flakehomas Noxon 09/13/2018 10:21 AM  Physician: Dr Toni Amendlapacs MD 09/13/2018 10:21 AM  Nursing: Milas HockShatara Powell RN 09/13/2018 10:21 AM  RN Care Manager: 09/13/2018 10:21 AM  Social Worker: Zollie Scalelivia Moton LCSW 09/13/2018 10:21 AM  Recreational Therapist: Garret ReddishShay Jamala Kohen CTRS LRT 09/13/2018 10:21 AM  Other: Penni HomansMichaela Stanfield LCSW 09/13/2018 10:21 AM  Other:  09/13/2018 10:21 AM  Other: 09/13/2018 10:21 AM    Scribe for Treatment Team: Charlann Langelivia K Moton, LCSW 09/13/2018 10:21 AM

## 2018-09-13 NOTE — [facility] Suicide Risk Assessment (Signed)
[facility] INPATIENT:  Family/Significant Other Suicide Prevention Education  Suicide Prevention Education:  Contact Attempts: [name], legal guardian, [phone] has been identiified by the patient as the family member/significant other with whom the patient will be residing, and identified as the person(s) who will aid the patient in the event of a mental health crisis.  With written consent from the patient, two attempts were made to provide suicide prevention education, prior to and/or following the patient's discharge.  We were unsuccessful in providing suicide prevention education.  A suicide education pamphlet was given to the patient to share with family/significant other.  Date and time of first attempt: 09/13/2018 at 11:42AM Date and time of second attempt: Second attempt is needed.   Harden Mo 09/13/2018, 11:41 AM
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
## 2018-09-13 NOTE — Plan of Care (Signed)
Patient is alert X 3, denies SI, HI and AVH. Patient is pleasant, and cooperative. Patient takes medications appropriately. Patient is isolative only coming out for meals and medications. Patient rates pain 0/10. Patient not participating in any groups, prefers to be alone. Safety checks to continue Q 15 minutes. Problem: Self-Concept: Goal: Ability to disclose and discuss suicidal ideas will improve Outcome: Progressing Goal: Will verbalize positive feelings about self Outcome: Progressing   Problem: Education: Goal: Knowledge of [facility] Education information/materials will improve Outcome: Progressing Goal: Emotional status will improve Outcome: Progressing Goal: Mental status will improve Outcome: Progressing Goal: Verbalization of understanding the information provided will improve Outcome: Progressing   Problem: Safety: Goal: Periods of time without injury will increase Outcome: Progressing

## 2018-09-13 NOTE — [facility] Suicide Risk Assessment (Signed)
[facility] Discharge Suicide Risk Assessment   Principal Problem: Schizoaffective disorder [facility]) Discharge Diagnoses: Principal Problem:   Schizoaffective disorder (HCC) Active Problems:   Moderate intellectual disability   Total Time spent with patient: 45 minutes  Musculoskeletal: Strength & Muscle Tone: within normal limits Gait & Station: normal Patient leans: Front  Psychiatric Specialty Exam: Review of Systems  Constitutional: Negative.   HENT: Negative.   Eyes: Negative.   Respiratory: Negative.   Cardiovascular: Negative.   Gastrointestinal: Negative.   Musculoskeletal: Negative.   Skin: Negative.   Neurological: Positive for focal weakness.  Psychiatric/Behavioral: Negative for depression, hallucinations, memory loss, substance abuse and suicidal ideas. The patient is nervous/anxious and has insomnia.     Blood pressure 121/82, pulse 70, temperature 99.1 F (37.3 C), temperature source Oral, resp. rate 18, height 5' 6.93" (1.7 m), weight 66.2 kg, SpO2 98 %.Body mass index is 22.92 kg/m.  General Appearance: Casual  Eye Contact::  Minimal  Speech:  Garbled and Slow409  Volume:  Decreased  Mood:  Dysphoric  Affect:  Constricted  Thought Process:  Coherent  Orientation:  Full (Time, Place, and Person)  Thought Content:  Logical and Paranoid Ideation  Suicidal Thoughts:  No  Homicidal Thoughts:  No  Memory:  Immediate;   Fair Recent;   Poor Remote;   Poor  Judgement:  Impaired  Insight:  Shallow  Psychomotor Activity:  Decreased  Concentration:  Poor  Recall:  Poor  Fund of Knowledge:Poor  Language: Poor  Akathisia:  No  Handed:  Right  AIMS (if indicated):     Assets:  Desire for Improvement Financial Resources/Insurance Housing  Sleep:  Number of Hours: 6.75  Cognition: Impaired,  Moderate  ADL's:  Impaired   Mental Status Per Nursing Assessment::   On Admission:  Suicidal ideation indicated by patient  Demographic Factors:  Male and  Caucasian  Loss Factors: Recent disruption in having to move from one group home to another  Historical Factors: Impulsivity  Risk Reduction Factors:   Living with another person, especially a relative, Positive social support and Positive therapeutic relationship  Continued Clinical Symptoms:  Bipolar Disorder:   Mixed State  Cognitive Features That Contribute To Risk:  Closed-mindedness    Suicide Risk:  Minimal: No identifiable suicidal ideation.  Patients presenting with no risk factors but with morbid ruminations; may be classified as minimal risk based on the severity of the depressive symptoms    Plan Of Care/Follow-up recommendations:  Activity:  Activity as tolerated Diet:  Regular diet Other:  Patient is not reporting any current suicidal ideation.  Denies any suicide attempts since being an adult.  He admits that he only said he was suicidal in order to get into the hospital and get away from his group home.  Intellectual disability makes him unlikely to benefit from programming as well.  No need for further inpatient stay.  Patient informed that we will plan on discharge tomorrow with local mental health follow-up.  He is agreeable to the plan.  Mordecai Rasmussen, MD 09/13/2018, 2:22 PM

## 2018-09-13 NOTE — Progress Notes (Signed)
Patient alert and oriented x 4, affect is flat but he brightens upon approach, no distress noted, thoughts are organized and coherent interacting appropriately with peers and staff and complaint with prescribed medication regimen. 15 minutes safety checkjs maintained will continue to monitor.

## 2018-09-13 NOTE — Progress Notes (Signed)
Recreation Therapy Notes  INPATIENT RECREATION THERAPY ASSESSMENT  Patient Details Name: Miguel Henry MRN: [id number] DOB: Aug 06, 1970 Today's Date: 09/13/2018       Information Obtained From: Patient  Able to Participate in Assessment/Interview: Yes  Patient Presentation: Responsive  Reason for Admission (Per Patient): Active Symptoms, Suicidal Ideation  Patient Stressors:    Coping Skills:   Substance Abuse  Leisure Interests (2+):     Frequency of Recreation/Participation:    Awareness of Community Resources:     Community Resources:     Current Use:    If no, Barriers?:    Expressed Interest in [employer] Information:    [place] of Residence:     Patient Main Form of Transportation:    Patient Strengths:  N/A  Patient Identified Areas of Improvement:  N/A  Patient Goal for Hospitalization:  To get out  Current SI (including self-harm):     Current HI:     Current AVH:    Staff Intervention Plan: Group Attendance, Collaborate with Interdisciplinary Treatment Team  Consent to Intern Participation: N/A  Gigi Onstad 09/13/2018, 1:43 PM

## 2018-09-14 MED ORDER — TUBERCULIN PPD 5 UNIT/0.1ML ID SOLN
5.0000 [IU] | Freq: Once | INTRADERMAL | Status: DC
  Administered 2018-09-14: 5 [IU] via INTRADERMAL
  Filled 2018-09-14: qty 0.1

## 2018-09-14 NOTE — Plan of Care (Signed)
  Problem: Self-Concept: Goal: Ability to disclose and discuss suicidal ideas will improve Outcome: Progressing Goal: Will verbalize positive feelings about self Outcome: Progressing  D: Has been isolative to room. Denies SI, HI and AVH. Continues to ambulate with walker. Medication compliant. Pleasant and cooperative. A: Continue to monitor for safety. R: Safety maintained.

## 2018-09-14 NOTE — [place] FL2 (Signed)
[place] MEDICAID FL2 LEVEL OF CARE SCREENING TOOL     IDENTIFICATION  Patient Name: Miguel Henry Birthdate: Jul 12, 1970 Sex: male Admission Date (Current Location): 09/11/2018  [place]   Facility and Address:  [facility], [address], [place], [place] [address]      Provider Number: [id number]  Attending Physician Name and Address:  Audery Amellapacs, John T, MD  Relative Name and Phone Number:  Magdalene RiverChavis Gash, legal guardian [phone]    Current Level of Care: Hospital Recommended Level of Care: Assisted Living Facility, Other (Comment)(group home) Prior Approval Number:    Date Approved/Denied:   PASRR Number:    Discharge Plan: Other (Comment)(back to group home)    Current Diagnoses: Patient Active Problem List   Diagnosis Date Noted  . 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 Schizoaffective disorder (HCC) 09/11/2018  . Suicidal ideation     Orientation RESPIRATION BLADDER Height & Weight     Self, Time, Situation, Place  Normal Continent Weight: 146 lb (66.2 kg) Height:  5' 6.93" (170 cm)  BEHAVIORAL SYMPTOMS/MOOD NEUROLOGICAL BOWEL NUTRITION STATUS  (none reported)   Continent Diet(normal)  AMBULATORY STATUS COMMUNICATION OF NEEDS Skin   Independent(uses walker) Verbally Normal                       Personal Care Assistance Level of Assistance  Dressing, Bathing Bathing Assistance: Limited assistance   Dressing Assistance: Limited assistance     Functional Limitations Info  (none reported)          SPECIAL CARE FACTORS FREQUENCY                       Contractures Contractures Info: Not present    Additional Factors Info  Code Status Code Status Info: full             Current Medications (09/14/2018):  This is the current hospital active medication list Current Facility-Administered Medications  Medication Dose Route Frequency Provider Last Rate Last Dose  .  acetaminophen (TYLENOL) tablet 650 mg  650 mg Oral Q6H PRN Thalia PartyPaliy, Alisa, MD      . alum & mag hydroxide-simeth (MAALOX/MYLANTA) 200-200-20 MG/5ML suspension 30 mL  30 mL Oral Q4H PRN Paliy, Alisa, MD      . haloperidol (HALDOL) tablet 2 mg  2 mg Oral BID Thalia PartyPaliy, Alisa, MD   2 mg at 09/14/18 0756  . haloperidol decanoate (HALDOL DECANOATE) 100 MG/ML injection 50 mg  50 mg Intramuscular Q30 days Clapacs, John T, MD   50 mg at 09/13/18 1452  . hydrOXYzine (ATARAX/VISTARIL) tablet 25 mg  25 mg Oral TID PRN Thalia PartyPaliy, Alisa, MD   25 mg at 09/11/18 2157  . lithium carbonate (ESKALITH) CR tablet 450 mg  450 mg Oral Q12H Clapacs, Jackquline DenmarkJohn T, MD   450 mg at 09/14/18 0755  . loratadine (CLARITIN) tablet 10 mg  10 mg Oral Daily Thalia PartyPaliy, Alisa, MD   10 mg at 09/14/18 0756  . magnesium hydroxide (MILK OF MAGNESIA) suspension 30 mL  30 mL Oral Daily PRN Thalia PartyPaliy, Alisa, MD      . Melatonin TABS 5 mg  5 mg Oral QHS Thalia PartyPaliy, Alisa, MD   5 mg at 09/13/18 2123  . Oxcarbazepine (TRILEPTAL) tablet 300 mg  300 mg Oral BID Thalia PartyPaliy, Alisa, MD   300 mg at 09/14/18 0756  . QUEtiapine (SEROQUEL) tablet 50 mg  50 mg Oral QHS PRN Paliy, Serina CowperAlisa,  MD      . traZODone (DESYREL) tablet 50 mg  50 mg Oral QHS PRN Thalia Party, MD   50 mg at 09/11/18 2157     Discharge Medications: Please see discharge summary for a list of discharge medications.  Relevant Imaging Results:  Relevant Lab Results:   Additional Information    [employer], LCSW

## 2018-09-14 NOTE — Progress Notes (Signed)
Recreation Therapy Notes   Date: 09/14/2018  Time: 9:30 am   Location: Craft room   Behavioral response: N/A   Intervention Topic: Goals  Discussion/Intervention: Patient did not attend group.   Clinical Observations/Feedback:  Patient did not attend group.   Buffie Herne LRT/CTRS        Miguel Henry 09/14/2018 11:34 AM 

## 2018-09-14 NOTE — Progress Notes (Signed)
Recreation Therapy Notes  INPATIENT RECREATION TR PLAN  Patient Details Name: Miguel Henry MRN: [id number] DOB: 1971/02/23 Today's Date: 09/14/2018  Rec Therapy Plan Is patient appropriate for Therapeutic Recreation?: Yes Treatment times per week: at least 3 Estimated Length of Stay: 5-7 days TR Treatment/Interventions: Group participation (Comment)  Discharge Criteria Pt will be discharged from therapy if:: Discharged Treatment plan/goals/alternatives discussed and agreed upon by:: Patient/family  Discharge Summary Short term goals set: Patient will engage in groups without prompting or encouragement from LRT x3 group sessions within 5 recreation therapy group sessions Short term goals met: Not met Reason goals not met: Patient did not attend any groups Therapeutic equipment acquired: N/A Reason patient discharged from therapy: Discharge from hospital Pt/family agrees with progress & goals achieved: Yes Date patient discharged from therapy: 09/14/18   [name] 09/14/2018, 11:55 AM

## 2018-09-14 NOTE — [facility] Suicide Risk Assessment (Signed)
[facility] INPATIENT:  Family/Significant Other Suicide Prevention Education  Suicide Prevention Education:  Education Completed; Jyl Heinz, legal guardian [phone] has been identified by the patient as the family member/significant other with whom the patient will be residing, and identified as the person(s) who will aid the patient in the event of a mental health crisis (suicidal ideations/suicide attempt).  With written consent from the patient, the family member/significant other has been provided the following suicide prevention education, prior to the and/or following the discharge of the patient.  The suicide prevention education provided includes the following:  Suicide risk factors  Suicide prevention and interventions  National Suicide Hotline telephone number  [facility] assessment telephone number  [facility] Emergency Assistance 911  [facility] and/or Residential Mobile Crisis Unit telephone number  Request made of family/significant other to:  Remove weapons (e.g., guns, rifles, knives), all items previously/currently identified as safety concern.    Remove drugs/medications (over-the-counter, prescriptions, illicit drugs), all items previously/currently identified as a safety concern.  The family member/significant other verbalizes understanding of the suicide prevention education information provided.  The family member/significant other agrees to remove the items of safety concern listed above.  [name] reported that he was unaware pt was on the behavioral medicine unit and was under the impression that pt was at the hospital for his foot. [name] reported that pt has never been on a haldol injections because he does not have anyone in the community to administer the injection. [name] reported that pt does not have a mental health provider in the community and was agreeable to referring pt to [facility]. [name] denied having access to a fax to sign ROI and consent  forms. [name] denied any SI/HI concerns for pt and denied any concerns with pt returning to group home.  Charlann Lange Janelle Spellman MSW LCSW 09/14/2018, 9:24 AM

## 2018-09-14 NOTE — Progress Notes (Signed)
D: Has been isolative to room. Denies SI, HI and AVH. Continues to ambulate with walker. Medication compliant. Pleasant and cooperative. A: Continue to monitor for safety. R: Safety maintained.

## 2018-09-14 NOTE — Progress Notes (Signed)
CSW spoke with Jyl Heinz, pts legal guardian [phone] who gave verbal permission [SW assistance Joanne Chars as witness] to send referral to [facility] for pts follow up. [name] confirmed pts group home as [facility] [phone]. CSW informed [name] that pt will be discharging today back to group home and CSW will call group home to schedule a time for pick up.   Iris Pert, MSW, LCSW Clinical Social Work 09/14/2018 9:35 AM

## 2018-09-14 NOTE — [facility] Group Notes (Signed)
  LCSW Group Therapy Note  09/14/2018 2:26 PM   Type of Therapy/Topic:  Group Therapy:  Feelings about Diagnosis  Participation Level:  Did Not Attend   Description of Group:   This group will allow patients to explore their thoughts and feelings about diagnoses they have received. Patients will be guided to explore their level of understanding and acceptance of these diagnoses. Facilitator will encourage patients to process their thoughts and feelings about the reactions of others to their diagnosis and will guide patients in identifying ways to discuss their diagnosis with significant others in their lives. This group will be process-oriented, with patients participating in exploration of their own experiences, giving and receiving support, and processing challenge from other group members.   Therapeutic Goals: 1. Patient will demonstrate understanding of diagnosis as evidenced by identifying two or more symptoms of the disorder 2. Patient will be able to express two feelings regarding the diagnosis 3. Patient will demonstrate their ability to communicate their needs through discussion and/or role play  Summary of Patient Progress: x   Therapeutic Modalities:   Cognitive Behavioral Therapy Brief Therapy Feelings Identification    Iris Pert, MSW, LCSW Clinical Social Work 09/14/2018 2:26 PM

## 2018-09-14 NOTE — Progress Notes (Signed)
  [facility] Adult Case Management Discharge Plan :  Will you be returning to the same living situation after discharge:  Yes,  [facility] group home At discharge, do you have transportation home?: Yes,  group home will pick pt up Do you have the ability to pay for your medications: Yes,  medicare insurance  Release of information consent forms completed and in the chart;   Patient to Follow up at: Follow-up Information    [facility] Follow up.   Specialty:  Behavioral Health Why:  A referral has been sent to [facility] for you. They will contact your legal guardian directly to schedule an appointment. Contact information: [address] [facility] [place] [address] [phone]          Next level of care provider has access to [facility] Link:yes  Safety Planning and Suicide Prevention discussed: Yes,  SPE completed with pts legal guardian  Have you used any form of tobacco in the last 30 days? (Cigarettes, Smokeless Tobacco, Cigars, and/or Pipes): Yes  Has patient been referred to the Quitline?: Patient refused referral  Patient has been referred for addiction treatment: N/A  Mechele Dawley, LCSW 09/14/2018, 9:57 AM

## 2018-09-14 NOTE — Progress Notes (Signed)
Patient received TB injection, right forearm. Patient tolerated injection well. Site, marked.

## 2018-09-14 NOTE — Discharge Summary (Signed)
Physician Discharge Summary Note  Patient:  Miguel Henry is an 48 y.o., male MRN:  [id number] DOB:  June 13, 1970 Patient phone:  [phone] (home)  Patient address:   Homeless,  Total Time spent with patient: 45 minutes  Date of Admission:  09/11/2018 Date of Discharge: Sep 14, 2018  Reason for Admission: Admitted through the emergency room where he initially presented with medical complaints but later made complaints of having suicidal ideation.  Principal Problem: Schizoaffective disorder [facility](HCC) Discharge Diagnoses: Principal Problem:   Schizoaffective disorder (HCC) Active Problems:   Moderate intellectual disability   Past Psychiatric History: Patient evidently has a history of chronic mental illness.  Diagnosis is schizoaffective disorder but he appears also to have chronic cognitive and neurologic impairment.  Does have a prior history of hospitalization.  Apparently has at least some history in the past of self injury.  Past Medical History:  Past Medical History:  Diagnosis Date  . Bipolar 1 disorder (HCC)   . HIV (human immunodeficiency virus infection) (HCC)   . Schizophrenic disorder (HCC)   . Tobacco abuse    History reviewed. No pertinent surgical history. Family History: History reviewed. No pertinent family history. Family Psychiatric  History: None reported Social History:  Social History   Substance and Sexual Activity  Alcohol Use Yes   Comment: when able to get some     Social History   Substance and Sexual Activity  Drug Use Not Currently  . Types: Marijuana, Cocaine    Social History   Socioeconomic History  . Marital status: Single    Spouse name: Not on file  . Number of children: Not on file  . Years of education: Not on file  . Highest education level: Not on file  Occupational History  . Not on file  Social Needs  . Financial resource strain: Not on file  . Food insecurity:    Worry: Not on file    Inability: Not on file  .  Transportation needs:    Medical: Not on file    Non-medical: Not on file  Tobacco Use  . Smoking status: Current Every Day Smoker    Types: Cigarettes  . Smokeless tobacco: Former Engineer, waterUser  Substance and Sexual Activity  . Alcohol use: Yes    Comment: when able to get some  . Drug use: Not Currently    Types: Marijuana, Cocaine  . Sexual activity: Not on file  Lifestyle  . Physical activity:    Days per week: Not on file    Minutes per session: Not on file  . Stress: Not on file  Relationships  . Social connections:    Talks on phone: Not on file    Gets together: Not on file    Attends religious service: Not on file    Active member of club or organization: Not on file    Attends meetings of clubs or organizations: Not on file    Relationship status: Not on file  Other Topics Concern  . Not on file  Social History Narrative  . Not on file    Hospital Course: Patient admitted to the psychiatric ward.  15-minute checks maintained.  Patient did not show any dangerous aggressive or suicidal behavior on the ward.  He was able to participate appropriately in evaluation by individuals in the treatment team.  He denied any suicidal ideation.  Articulated that while he did not like the group home he did not have any specific complaint against it and ultimately was  agreeable to returning.  No new medical complaints.  No dangerous or aggressive behavior.  No evidence of psychosis.  Tried to keep the medicines the same as they had been previously based on the old FL 2 form that was sent along with him.  Physical Findings: AIMS:  , ,  ,  ,    CIWA:    COWS:     Musculoskeletal: Strength & Muscle Tone: spastic Gait & Station: unsteady, ataxic Patient leans: N/A  Psychiatric Specialty Exam: Physical Exam  Nursing note and vitals reviewed. Constitutional: He appears well-developed and well-nourished.  HENT:  Head: Normocephalic and atraumatic.  Eyes: Pupils are equal, round, and  reactive to light. Conjunctivae are normal.  Neck: Normal range of motion.  Cardiovascular: Regular rhythm and normal heart sounds.  Respiratory: Effort normal. No respiratory distress.  GI: Soft.  Musculoskeletal: Normal range of motion.  Neurological: He is alert.  Abnormal tone and abnormal gait evidently the result of a major injury years ago.  Skin: Skin is warm and dry.  Psychiatric: He has a normal mood and affect. His speech is normal and behavior is normal. Judgment and thought content normal. Cognition and memory are impaired.    Review of Systems  Constitutional: Negative.   HENT: Negative.   Eyes: Negative.   Respiratory: Negative.   Cardiovascular: Negative.   Gastrointestinal: Negative.   Musculoskeletal: Negative.   Skin: Negative.   Neurological: Negative.   Psychiatric/Behavioral: Negative.     Blood pressure 134/83, pulse 77, temperature 97.7 F (36.5 C), temperature source Oral, resp. rate 18, height 5' 6.93" (1.7 m), weight 66.2 kg, SpO2 100 %.Body mass index is 22.92 kg/m.  General Appearance: Casual  Eye Contact:  Fair  Speech:  Slow  Volume:  Decreased  Mood:  Euthymic  Affect:  Congruent  Thought Process:  Goal Directed  Orientation:  Full (Time, Place, and Person)  Thought Content:  Logical  Suicidal Thoughts:  No  Homicidal Thoughts:  No  Memory:  Immediate;   Fair Recent;   Fair Remote;   Fair  Judgement:  Fair  Insight:  Fair  Psychomotor Activity:  Normal  Concentration:  Concentration: Fair  Recall:  [employer] of Knowledge:  Fair  Language:  Fair  Akathisia:  No  Handed:  Right  AIMS (if indicated):     Assets:  Desire for Improvement Financial Resources/Insurance Housing  ADL's:  Intact  Cognition:  Impaired,  Mild  Sleep:  Number of Hours: 6.75     Have you used any form of tobacco in the last 30 days? (Cigarettes, Smokeless Tobacco, Cigars, and/or Pipes): Yes  Has this patient used any form of tobacco in the last 30 days?  (Cigarettes, Smokeless Tobacco, Cigars, and/or Pipes) Yes, Yes, A prescription for an FDA-approved tobacco cessation medication was offered at discharge and the patient refused  Blood Alcohol level:  Lab Results  Component Value Date   ETH <10 09/10/2018    Metabolic Disorder Labs:  No results found for: HGBA1C, MPG No results found for: PROLACTIN No results found for: CHOL, TRIG, HDL, CHOLHDL, VLDL, LDLCALC  See Psychiatric Specialty Exam and Suicide Risk Assessment completed by Attending Physician prior to discharge.  Discharge destination:  Home  Is patient on multiple antipsychotic therapies at discharge:  No   Has Patient had three or more failed trials of antipsychotic monotherapy by history:  No  Recommended Plan for Multiple Antipsychotic Therapies: NA  Discharge Instructions    Diet - low  sodium heart healthy   Complete by:  As directed    Increase activity slowly   Complete by:  As directed      Allergies as of 09/14/2018   No Known Allergies     Medication List    STOP taking these medications   cetirizine 10 MG tablet Commonly known as:  ZYRTEC Replaced by:  loratadine 10 MG tablet   haloperidol decanoate 50 MG/ML injection Commonly known as:  HALDOL DECANOATE Replaced by:  haloperidol decanoate 100 MG/ML injection   PARoxetine 10 MG tablet Commonly known as:  PAXIL     TAKE these medications     Indication  haloperidol 2 MG tablet Commonly known as:  HALDOL Take 1 tablet (2 mg total) by mouth 2 (two) times daily.  Indication:  Psychosis   haloperidol decanoate 100 MG/ML injection Commonly known as:  HALDOL DECANOATE Inject 0.5 mLs (50 mg total) into the muscle every 30 (thirty) days. Replaces:  haloperidol decanoate 50 MG/ML injection  Indication:  Schizophrenia   lithium carbonate 450 MG CR tablet Commonly known as:  ESKALITH Take 1 tablet (450 mg total) by mouth every 12 (twelve) hours. What changed:  when to take this  Indication:   Schizoaffective Disorder   loratadine 10 MG tablet Commonly known as:  CLARITIN Take 1 tablet (10 mg total) by mouth daily. Replaces:  cetirizine 10 MG tablet  Indication:  Perennial Allergic Rhinitis   Melatonin 5 MG Tabs Take 1 tablet (5 mg total) by mouth at bedtime. What changed:    medication strength  how much to take  Indication:  Trouble Sleeping   Oxcarbazepine 300 MG tablet Commonly known as:  TRILEPTAL Take 1 tablet (300 mg total) by mouth 2 (two) times daily.  Indication:  Schizoaffective disorder   QUEtiapine 25 MG tablet Commonly known as:  SEROQUEL Take 2 tablets (50 mg total) by mouth at bedtime as needed (agitation). What changed:  Another medication with the same name was removed. Continue taking this medication, and follow the directions you see here.  Indication:  Schizophrenia   traZODone 50 MG tablet Commonly known as:  DESYREL Take 1 tablet (50 mg total) by mouth at bedtime as needed for sleep.  Indication:  Trouble Sleeping      Follow-up Information    [facility] Follow up.   Specialty:  Behavioral Health Why:  A referral has been sent to [facility] for you. They will contact your legal guardian directly to schedule an appointment. Contact information: [address] [facility] [place] [address] [phone]          Follow-up recommendations:  Activity:  Activity as tolerated Diet:  Regular diet Other:  Follow-up with outpatient treatment.  Arrangements are being made for the patient to follow-up in [facility].  Prescriptions given for current medicine.  Comments: Patient had a negative lithium level on presentation but it seems to have been listed clearly on his referral form that he was on lithium so this has been restarted.  Patient appears to be tolerating medicine without difficulty.  Discharged with prescriptions.  Signed: Mordecai Rasmussen,  MD 09/14/2018, 10:19 AM

## 2018-09-24 ENCOUNTER — Other Ambulatory Visit: Payer: Self-pay

## 2018-09-28 ENCOUNTER — Ambulatory Visit: Payer: Medicare Other | Admitting: Family Medicine

## 2018-10-12 ENCOUNTER — Encounter: Payer: Self-pay | Admitting: Podiatry

## 2018-10-12 ENCOUNTER — Ambulatory Visit (INDEPENDENT_AMBULATORY_CARE_PROVIDER_SITE_OTHER): Payer: Medicare Other

## 2018-10-12 ENCOUNTER — Other Ambulatory Visit: Payer: Self-pay

## 2018-10-12 ENCOUNTER — Other Ambulatory Visit: Payer: Self-pay | Admitting: Psychiatry

## 2018-10-12 ENCOUNTER — Ambulatory Visit (INDEPENDENT_AMBULATORY_CARE_PROVIDER_SITE_OTHER): Payer: Medicare Other | Admitting: Podiatry

## 2018-10-12 VITALS — BP 107/68 | HR 61

## 2018-10-12 DIAGNOSIS — M21619 Bunion of unspecified foot: Secondary | ICD-10-CM

## 2018-10-12 DIAGNOSIS — M21611 Bunion of right foot: Secondary | ICD-10-CM

## 2018-10-12 NOTE — Patient Instructions (Signed)
Pre-Operative Instructions  Congratulations, you have decided to take an important step towards improving your quality of life.  You can be assured that the doctors and staff at [facility] will be with you every step of the way.  Here are some important things you should know:  1. Plan to be at the surgery center/hospital at least 1 (one) hour prior to your scheduled time, unless otherwise directed by the surgical center/hospital staff.  You must have a responsible adult accompany you, remain during the surgery and drive you home.  Make sure you have directions to the surgical center/hospital to ensure you arrive on time. 2. If you are having surgery at [facility] or [facility], you will need a copy of your medical history and physical form from your family physician within one month prior to the date of surgery. We will give you a form for your primary physician to complete.  3. We make every effort to accommodate the date you request for surgery.  However, there are times where surgery dates or times have to be moved.  We will contact you as soon as possible if a change in schedule is required.   4. No aspirin/ibuprofen for one week before surgery.  If you are on aspirin, any non-steroidal anti-inflammatory medications (Mobic, Aleve, Ibuprofen) should not be taken seven (7) days prior to your surgery.  You make take Tylenol for pain prior to surgery.  5. Medications - If you are taking daily heart and blood pressure medications, seizure, reflux, allergy, asthma, anxiety, pain or diabetes medications, make sure you notify the surgery center/hospital before the day of surgery so they can tell you which medications you should take or avoid the day of surgery. 6. No food or drink after midnight the night before surgery unless directed otherwise by surgical center/hospital staff. 7. No alcoholic beverages 24-hours prior to surgery.  No smoking 24-hours prior or 24-hours after  surgery. 8. Wear loose pants or shorts. They should be loose enough to fit over bandages, boots, and casts. 9. Don't wear slip-on shoes. Sneakers are preferred. 10. Bring your boot with you to the surgery center/hospital.  Also bring crutches or a walker if your physician has prescribed it for you.  If you do not have this equipment, it will be provided for you after surgery. 11. If you have not been contacted by the surgery center/hospital by the day before your surgery, call to confirm the date and time of your surgery. 12. Leave-time from work may vary depending on the type of surgery you have.  Appropriate arrangements should be made prior to surgery with your employer. 13. Prescriptions will be provided immediately following surgery by your doctor.  Fill these as soon as possible after surgery and take the medication as directed. Pain medications will not be refilled on weekends and must be approved by the doctor. 14. Remove nail polish on the operative foot and avoid getting pedicures prior to surgery. 15. Wash the night before surgery.  The night before surgery wash the foot and leg well with water and the antibacterial soap provided. Be sure to pay special attention to beneath the toenails and in between the toes.  Wash for at least three (3) minutes. Rinse thoroughly with water and dry well with a towel.  Perform this wash unless told not to do so by your physician.  Enclosed: 1 Ice pack (please put in freezer the night before surgery)   1 Hibiclens skin cleaner     Pre-op instructions  If you have any questions regarding the instructions, please do not hesitate to call our office.  [place]: [address], [place], [place] [address] -- [phone]  [place]: [address] [place], [place] [address] -- [phone]  [place]: [address]  [place], [place] [address] -- [phone]  [place]: [address], [address], [place], [place] [address] -- [phone]  Website:  [email or web address] 

## 2018-10-14 ENCOUNTER — Telehealth: Payer: Self-pay | Admitting: *Deleted

## 2018-10-14 NOTE — Telephone Encounter (Signed)
"  My name is Renetta Chalk.  I'm calling to schedule surgery for Miguel Henry."  Are you his caregiver?  "Yes, I am."  Dr. Amalia Hailey does surgeries on Thursdays.  Do you have a date that he would like?  "Dr. Amalia Hailey said sometime in July."  Dr. Amalia Hailey can do it on November 11, 2018.  "That date will be fine.  What time?"  Someone from the surgical center will give you a call a day or two prior to his surgery date and will give him his arrival time.  "Have them call me because I will be transporting him."  I will make note of it.

## 2018-10-14 NOTE — Progress Notes (Signed)
   Subjective: 48 year old male with PMHx of HIV presenting today as a new patient with a chief complaint of a painful bunion deformity of the right foot that has been gradually worsening over the past few years. He states he has had conservative treatment with pain medications in the past by other physicians. Walking and wearing shoes increases the pain. Patient is here for further evaluation and treatment.  Past Medical History:  Diagnosis Date  . Bipolar 1 disorder ([place])   . HIV (human immunodeficiency virus infection) ([place])   . Schizophrenic disorder ([place])   . Tobacco abuse       Objective: Physical Exam General: The patient is alert and oriented x3 in no acute distress.  Dermatology: Skin is cool, dry and supple bilateral lower extremities. Negative for open lesions or macerations.  Vascular: Palpable pedal pulses bilaterally. No edema or erythema noted. Capillary refill within normal limits.  Neurological: Epicritic and protective threshold grossly intact bilaterally.   Musculoskeletal Exam: Clinical evidence of bunion deformity noted to the respective foot. There is moderate pain on palpation range of motion of the first MPJ. Lateral deviation of the hallux noted consistent with hallux abductovalgus.  Radiographic Exam: Increased intermetatarsal angle greater than 15 with a hallux abductus angle greater than 30 noted on AP view. Moderate degenerative changes noted within the first MPJ.  Assessment: 1. HAV w/ bunion deformity right foot    Plan of Care:  1. Patient was evaluated. X-Rays reviewed. 2. Today we discussed the conservative versus surgical management of the presenting pathology. The patient opts for surgical management. All possible complications and details of the procedure were explained. All patient questions were answered. No guarantees were expressed or implied. 3. Authorization for surgery was initiated today. Surgery will consist of bunionectomy with  osteotomy right.  4. Return to clinic one week post op.   Goes by Gene.      Edrick Kins, DPM [facility]  Dr. Edrick Kins, [place]                                        [place], [place] [address]                Office [phone]  Fax [phone]

## 2018-11-03 ENCOUNTER — Telehealth: Payer: Self-pay | Admitting: *Deleted

## 2018-11-03 NOTE — Telephone Encounter (Signed)
"  There's a Miguel Henry scheduled for July 23.  He lives in an Assistant Living facility."  We need to reschedule him because we haven't gone into Phase 3 for the Covid Virus?  "Yes, that is correct.  Are you going to call him?"  Yes, I'll give him a call.

## 2018-11-05 NOTE — Telephone Encounter (Signed)
I left a message for his care taker that we have to cancel his surgery that's scheduled for November 11, 2018 because he lives in an assistant living facility.  Surgery is not allowed at this time at [facility] due to [facility] not releasing [name] into phase 3 due to an increase in Covid cases.

## 2018-11-08 NOTE — Telephone Encounter (Signed)
"  I got your message about the cancellation of his surgery.  Did you call the facility?"  I don't have a number for the facility.  "Let me give it to you because he is a ward of the state.  You'll need to call and let them know.  Let me give you that number.  It is [phone]."  Thank you, I'll give them a call.  I attempted to call the facility multiple times without success.  It is a fax number.  I left a message for Mr. Miguel Henry to give me a call back because I need a different number to reach the facility.

## 2018-11-09 ENCOUNTER — Other Ambulatory Visit: Payer: Self-pay | Admitting: Psychiatry

## 2018-11-10 NOTE — Telephone Encounter (Signed)
"  I'm calling concerning Miguel Henry.  Give me a call."  I am returning your call in regards to [facility].  How can I help you?  "You all canceled his surgery."  Yes, the facility we use is not able to do surgery on patients that live in a facility at this time due to Covid.  They are not able to do surgeries there until we get into Phase 3 for Covid.  "Okay, will you all call us when we get into Phase 3 to reschedule him or do we need to call you when we get into Phase 3?"  Give [name] a call when we get into Phase 3.  "Okay, we will.  Thank you so much."

## 2018-11-24 ENCOUNTER — Other Ambulatory Visit: Payer: Self-pay | Admitting: Psychiatry

## 2018-12-16 ENCOUNTER — Telehealth: Payer: Self-pay | Admitting: *Deleted

## 2018-12-16 ENCOUNTER — Telehealth: Payer: Self-pay | Admitting: Podiatry

## 2018-12-16 NOTE — Telephone Encounter (Signed)
[facility] called only pt's name, no Date of Birth or request.

## 2018-12-16 NOTE — Telephone Encounter (Signed)
Dr. Amalia Hailey, please advise on what medication you want to send in for this patient   Thanks!

## 2018-12-16 NOTE — Telephone Encounter (Signed)
Facility called stating the patient was scheduled for surgery earlier this year but it was cancelled due to COVID-19. Pt is still having pain and would like to know if any medication can be prescribed to help with the pain until he can get in for the surgery.   Please give [name] a call back.

## 2018-12-16 NOTE — Telephone Encounter (Signed)
I called Renetta Chalk and he states pt was unable to have surgery due to COVID and being in an assisted living facility and pt is in pain and can hardly walk. I asked for pt's date of birth and Jeneen Rinks said he was traveling and did not have, but left message with someone else at the office. I spoke with Myles Rosenthal - scheduler and she gave the Date of Birth and I charted message and sent to Dr. Amalia Hailey.

## 2018-12-21 ENCOUNTER — Other Ambulatory Visit: Payer: Self-pay | Admitting: Psychiatry

## 2018-12-31 ENCOUNTER — Telehealth: Payer: Self-pay | Admitting: *Deleted

## 2018-12-31 NOTE — Telephone Encounter (Signed)
"  I'm calling on behalf of Miguel Henry, who was originally scheduled to have an operation about two months ago.  Due to Covid, you all postponed it.  I'm just calling to see if we can reschedule it now.  Give me a call."

## 2019-01-06 NOTE — Telephone Encounter (Signed)
"  I've left several messages about scheduling [facility] surgery.  Are you all scheduling surgery now?  You weren't before because of Covid."  They are not allowing surgeries to be performed at [facility] at this time for patients living in an assisted living environment due to Covid.  "Well are they allowing it at other facilities?"  I am not sure.  "We'll just find him somewhere else to go because he's complaining about his foot everyday."

## 2019-02-05 ENCOUNTER — Encounter: Payer: Self-pay | Admitting: Emergency Medicine

## 2019-02-05 ENCOUNTER — Other Ambulatory Visit: Payer: Self-pay

## 2019-02-05 ENCOUNTER — Emergency Department
Admission: EM | Admit: 2019-02-05 | Discharge: 2019-02-11 | Disposition: A | Discharge status: Home / Self Care | Payer: Medicare Other | Attending: Emergency Medicine | Admitting: Emergency Medicine

## 2019-02-05 DIAGNOSIS — Z87891 Personal history of nicotine dependence: Secondary | ICD-10-CM | POA: Diagnosis not present

## 2019-02-05 DIAGNOSIS — F209 Schizophrenia, unspecified: Secondary | ICD-10-CM | POA: Insufficient documentation

## 2019-02-05 DIAGNOSIS — R4689 Other symptoms and signs involving appearance and behavior: Secondary | ICD-10-CM

## 2019-02-05 DIAGNOSIS — Z20828 Contact with and (suspected) exposure to other viral communicable diseases: Secondary | ICD-10-CM | POA: Insufficient documentation

## 2019-02-05 DIAGNOSIS — M6281 Muscle weakness (generalized): Secondary | ICD-10-CM | POA: Diagnosis not present

## 2019-02-05 DIAGNOSIS — R451 Restlessness and agitation: Secondary | ICD-10-CM | POA: Diagnosis present

## 2019-02-05 DIAGNOSIS — F4325 Adjustment disorder with mixed disturbance of emotions and conduct: Secondary | ICD-10-CM | POA: Diagnosis not present

## 2019-02-05 DIAGNOSIS — F259 Schizoaffective disorder, unspecified: Secondary | ICD-10-CM | POA: Diagnosis present

## 2019-02-05 DIAGNOSIS — Z21 Asymptomatic human immunodeficiency virus [HIV] infection status: Secondary | ICD-10-CM | POA: Diagnosis not present

## 2019-02-05 DIAGNOSIS — F250 Schizoaffective disorder, bipolar type: Secondary | ICD-10-CM | POA: Diagnosis not present

## 2019-02-05 DIAGNOSIS — Z79899 Other long term (current) drug therapy: Secondary | ICD-10-CM | POA: Insufficient documentation

## 2019-02-05 LAB — COMPREHENSIVE METABOLIC PANEL
ALT: 15 U/L (ref 0–44)
AST: 17 U/L (ref 15–41)
Albumin: 4.4 g/dL (ref 3.5–5.0)
Alkaline Phosphatase: 75 U/L (ref 38–126)
Anion gap: 8 (ref 5–15)
BUN: 15 mg/dL (ref 6–20)
CO2: 28 mmol/L (ref 22–32)
Calcium: 10 mg/dL (ref 8.9–10.3)
Chloride: 105 mmol/L (ref 98–111)
Creatinine, Ser: 0.87 mg/dL (ref 0.61–1.24)
GFR calc Af Amer: >60 mL/min (ref >60)
GFR calc non Af Amer: >60 mL/min (ref >60)
Glucose, Bld: 97 mg/dL (ref 70–99)
Potassium: 4.1 mmol/L (ref 3.5–5.1)
Sodium: 141 mmol/L (ref 135–145)
Total Bilirubin: 0.4 mg/dL (ref 0.3–1.2)
Total Protein: 7.4 g/dL (ref 6.5–8.1)

## 2019-02-05 LAB — URINE DRUG SCREEN, QUALITATIVE (ARMC ONLY)
Amphetamines, Ur Screen: NOT DETECTED
Barbiturates, Ur Screen: NOT DETECTED
Benzodiazepine, Ur Scrn: NOT DETECTED
Cannabinoid 50 Ng, Ur ~~LOC~~: NOT DETECTED
Cocaine Metabolite,Ur ~~LOC~~: NOT DETECTED
MDMA (Ecstasy)Ur Screen: NOT DETECTED
Methadone Scn, Ur: NOT DETECTED
Opiate, Ur Screen: NOT DETECTED
Phencyclidine (PCP) Ur S: NOT DETECTED
Tricyclic, Ur Screen: NOT DETECTED

## 2019-02-05 LAB — CBC
HCT: 44.5 % (ref 39.0–52.0)
Hemoglobin: 15 g/dL (ref 13.0–17.0)
MCH: 31.4 pg (ref 26.0–34.0)
MCHC: 33.7 g/dL (ref 30.0–36.0)
MCV: 93.1 fL (ref 80.0–100.0)
Platelets: 211 10*3/uL (ref 150–400)
RBC: 4.78 MIL/uL (ref 4.22–5.81)
RDW: 13.2 % (ref 11.5–15.5)
WBC: 10.9 10*3/uL — ABNORMAL HIGH (ref 4.0–10.5)
nRBC: 0 % (ref 0.0–0.2)

## 2019-02-05 LAB — SALICYLATE LEVEL: Salicylate Lvl: <7 mg/dL (ref 2.8–30.0)

## 2019-02-05 LAB — ACETAMINOPHEN LEVEL: Acetaminophen (Tylenol), Serum: <10 ug/mL — ABNORMAL LOW (ref 10–30)

## 2019-02-05 LAB — ETHANOL: Alcohol, Ethyl (B): <10 mg/dL (ref <10)
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
## 2019-02-05 NOTE — ED Notes (Signed)
Pt dressed out. Pt belonging bag:  1 black belt 1 blue jeans 1 pair white socks 1 pair grey shoes 1 brown wallet 1 red lighter 1 plaid shirt

## 2019-02-05 NOTE — ED Notes (Signed)
Patient is lying in bed, He is alert and oriented, no signs of distress at this time, will continue to monitor. Patient is cooperative, ask for food, nurse gave him a extra tray.

## 2019-02-05 NOTE — ED Notes (Signed)
MD at bedside. 
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
## 2019-02-05 NOTE — ED Notes (Signed)
Pt. Up using bathroom, pt. Uses a rolling walker next to bed.

## 2019-02-06 DIAGNOSIS — F4325 Adjustment disorder with mixed disturbance of emotions and conduct: Secondary | ICD-10-CM | POA: Diagnosis present

## 2019-02-06 DIAGNOSIS — F250 Schizoaffective disorder, bipolar type: Secondary | ICD-10-CM

## 2019-02-06 MED ORDER — BENZTROPINE MESYLATE 1 MG PO TABS
1.0000 mg | ORAL_TABLET | Freq: Every day | ORAL | Status: DC
  Administered 2019-02-06 – 2019-02-10 (×5): 1 mg via ORAL
  Filled 2019-02-06 (×10): qty 1

## 2019-02-06 MED ORDER — LITHIUM CARBONATE ER 450 MG PO TBCR
450.0000 mg | EXTENDED_RELEASE_TABLET | Freq: Two times a day (BID) | ORAL | Status: DC
  Administered 2019-02-06 – 2019-02-10 (×10): 450 mg via ORAL
  Filled 2019-02-06 (×13): qty 1

## 2019-02-06 MED ORDER — TRAZODONE HCL 50 MG PO TABS
50.0000 mg | ORAL_TABLET | Freq: Every evening | ORAL | Status: DC | PRN
  Administered 2019-02-09: 50 mg via ORAL
  Filled 2019-02-06 (×3): qty 1

## 2019-02-06 MED ORDER — PAROXETINE HCL 10 MG PO TABS
10.0000 mg | ORAL_TABLET | Freq: Every day | ORAL | Status: DC
  Administered 2019-02-06 – 2019-02-10 (×5): 10 mg via ORAL
  Filled 2019-02-06 (×7): qty 1

## 2019-02-06 MED ORDER — OXCARBAZEPINE 300 MG PO TABS
300.0000 mg | ORAL_TABLET | Freq: Two times a day (BID) | ORAL | Status: DC
  Administered 2019-02-06 – 2019-02-10 (×10): 300 mg via ORAL
  Filled 2019-02-06 (×15): qty 1

## 2019-02-06 MED ORDER — LORATADINE 10 MG PO TABS
10.0000 mg | ORAL_TABLET | Freq: Every day | ORAL | Status: DC
  Administered 2019-02-06 – 2019-02-10 (×5): 10 mg via ORAL
  Filled 2019-02-06 (×10): qty 1

## 2019-02-06 MED ORDER — MELATONIN 5 MG PO TABS
5.0000 mg | ORAL_TABLET | Freq: Every day | ORAL | Status: DC
  Administered 2019-02-06 – 2019-02-10 (×5): 5 mg via ORAL
  Filled 2019-02-06 (×5): qty 1

## 2019-02-06 MED ORDER — HALOPERIDOL 5 MG PO TABS
5.0000 mg | ORAL_TABLET | Freq: Two times a day (BID) | ORAL | Status: DC
  Administered 2019-02-06 – 2019-02-10 (×9): 5 mg via ORAL
  Filled 2019-02-06 (×15): qty 1

## 2019-02-06 MED ORDER — HALOPERIDOL 2 MG PO TABS
2.0000 mg | ORAL_TABLET | Freq: Two times a day (BID) | ORAL | Status: DC
  Administered 2019-02-06: 09:00:00 2 mg via ORAL
  Filled 2019-02-06: qty 1

## 2019-02-06 MED ORDER — QUETIAPINE FUMARATE 25 MG PO TABS
50.0000 mg | ORAL_TABLET | Freq: Every evening | ORAL | Status: DC | PRN
  Administered 2019-02-09: 50 mg via ORAL
  Filled 2019-02-06 (×3): qty 2

## 2019-02-06 NOTE — ED Notes (Signed)
Report from [place]. Patient is asleep at this time.

## 2019-02-06 NOTE — ED Notes (Signed)
VOL pend SW placement, moved to CPOD

## 2019-02-06 NOTE — ED Notes (Signed)
Group home refusing patient at this time. Miguel Henry TTS aware. Will consult SW. Pt calm and cooperative at this time.

## 2019-02-06 NOTE — Consult Note (Signed)
[facility] Psych ED Discharge  02/06/2019 2:54 PM Miguel Henry  MRN:  [id number] Principal Problem: Adjustment disorder with mixed disturbance of emotions and conduct Discharge Diagnoses: Adjustment disorder with mixed disturbance of emotions and conduct  Subjective: "Can I go now?"  Patient is seen and assessed in person.  Miguel Henry is a 48 y.o. male who presents to the emergency department after period of agitation at his group home.  Per IVC paperwork patient was throwing objects and other resident.  Patient presents in the emergency department verbalizing regretting what happened and he denies any intention to hurt anyone.  Patient denies any suicidal ideation, homicidal ideation, auditory or visual hallucinations at this time.    After collateral was obtained from group home and patient was psychiatrically cleared group home advised hospital patient was no longer welcome to return.  Patient made aware of this update and remained in behavior and control.  Social work following up on secondary placement at this time.     Total Time spent with patient: 1 hour  Past Psychiatric History: Per chart patient has a history of schizophrenia and bipolar disorder  Past Medical History:  Past Medical History:  Diagnosis Date  . Bipolar 1 disorder (HCC)   . HIV (human immunodeficiency virus infection) (HCC)   . Schizophrenic disorder (HCC)   . Tobacco abuse    History reviewed. No pertinent surgical history. Family History: No family history on file. Family Psychiatric  History: none reported Social History:  Social History   Substance and Sexual Activity  Alcohol Use Yes   Comment: when able to get some     Social History   Substance and Sexual Activity  Drug Use Not Currently  . Types: Marijuana, Cocaine    Social History   Socioeconomic History  . Marital status: Single    Spouse name: Not on file  . Number of children: Not on file  . Years of education: Not on file  .  Highest education level: Not on file  Occupational History  . Not on file  Social Needs  . Financial resource strain: Not on file  . Food insecurity    Worry: Not on file    Inability: Not on file  . Transportation needs    Medical: Not on file    Non-medical: Not on file  Tobacco Use  . Smoking status: Current Every Day Smoker    Types: Cigarettes  . Smokeless tobacco: Former Engineer, water and Sexual Activity  . Alcohol use: Yes    Comment: when able to get some  . Drug use: Not Currently    Types: Marijuana, Cocaine  . Sexual activity: Not on file  Lifestyle  . Physical activity    Days per week: Not on file    Minutes per session: Not on file  . Stress: Not on file  Relationships  . Social Musician on phone: Not on file    Gets together: Not on file    Attends religious service: Not on file    Active member of club or organization: Not on file    Attends meetings of clubs or organizations: Not on file    Relationship status: Not on file  Other Topics Concern  . Not on file  Social History Narrative  . Not on file    Has this patient used any form of tobacco in the last 30 days? (Cigarettes, Smokeless Tobacco, Cigars, and/or Pipes) Prescription not provided because: patient refused  Current Medications: Current Facility-Administered Medications  Medication Dose Route Frequency Provider Last Rate Last Dose  . benztropine (COGENTIN) tablet 1 mg  1 mg Oral Daily Patrecia Pour, NP   1 mg at 02/06/19 1000  . haloperidol (HALDOL) tablet 5 mg  5 mg Oral BID Patrecia Pour, NP      . lithium carbonate (ESKALITH) CR tablet 450 mg  450 mg Oral Q12H Patrecia Pour, NP   450 mg at 02/06/19 0911  . loratadine (CLARITIN) tablet 10 mg  10 mg Oral Daily Patrecia Pour, NP   10 mg at 02/06/19 0910  . Melatonin TABS 5 mg  5 mg Oral QHS Patrecia Pour, NP      . Oxcarbazepine (TRILEPTAL) tablet 300 mg  300 mg Oral BID Patrecia Pour, NP   300 mg at 02/06/19 0910   . PARoxetine (PAXIL) tablet 10 mg  10 mg Oral Daily Patrecia Pour, NP   10 mg at 02/06/19 0910  . QUEtiapine (SEROQUEL) tablet 50 mg  50 mg Oral QHS PRN Patrecia Pour, NP      . traZODone (DESYREL) tablet 50 mg  50 mg Oral QHS PRN Patrecia Pour, NP       Current Outpatient Medications  Medication Sig Dispense Refill  . haloperidol (HALDOL) 2 MG tablet Take 1 tablet (2 mg total) by mouth 2 (two) times daily. 60 tablet 1  . haloperidol decanoate (HALDOL DECANOATE) 100 MG/ML injection Inject 0.5 mLs (50 mg total) into the muscle every 30 (thirty) days. 1 mL 1  . lithium carbonate (ESKALITH) 450 MG CR tablet Take 1 tablet (450 mg total) by mouth every 12 (twelve) hours. 60 tablet 1  . loratadine (CLARITIN) 10 MG tablet Take 1 tablet (10 mg total) by mouth daily. 30 tablet 1  . Melatonin 5 MG TABS Take 1 tablet (5 mg total) by mouth at bedtime. 30 each 1  . Oxcarbazepine (TRILEPTAL) 300 MG tablet Take 1 tablet (300 mg total) by mouth 2 (two) times daily. 60 tablet 1  . PARoxetine (PAXIL) 10 MG tablet Take 10 mg by mouth daily.    . QUEtiapine (SEROQUEL) 25 MG tablet Take 2 tablets (50 mg total) by mouth at bedtime as needed (agitation). 30 tablet 1  . traZODone (DESYREL) 50 MG tablet Take 1 tablet (50 mg total) by mouth at bedtime as needed for sleep. 30 tablet 1   PTA Medications: (Not in a hospital admission)   Musculoskeletal: Strength & Muscle Tone: within normal limits Gait & Station: patient in wheel chair Patient leans: N/A  Psychiatric Specialty Exam: Physical Exam  Nursing note and vitals reviewed. Constitutional: He is oriented to person, place, and time. He appears well-developed and well-nourished.  HENT:  Head: Normocephalic.  Neck: Normal range of motion.  Cardiovascular: Normal rate.  Respiratory: Effort normal.  Musculoskeletal: Normal range of motion.  Neurological: He is alert and oriented to person, place, and time.  Skin: Skin is dry.  Psychiatric: His  speech is normal and behavior is normal. Thought content normal. His mood appears anxious. His affect is blunt. Thought content is not paranoid. Cognition and memory are impaired. He expresses impulsivity. He expresses no homicidal and no suicidal ideation.    Review of Systems  Constitutional: Negative.   HENT: Negative.   Eyes: Negative.   Respiratory: Negative.   Cardiovascular: Negative.   Gastrointestinal: Negative.   Genitourinary: Negative.   Musculoskeletal: Negative.   Skin: Negative.  Neurological: Negative.   Endo/Heme/Allergies: Negative.   Psychiatric/Behavioral: Negative for memory loss. The patient is nervous/anxious.   All other systems reviewed and are negative.   Blood pressure 108/75, pulse 73, temperature 98.7 F (37.1 C), temperature source Oral, resp. rate 17, weight 66.2 kg, SpO2 98 %.Body mass index is 22.91 kg/m.  General Appearance: Disheveled  Eye Contact:  Fair  Speech:  Normal Rate  Volume:  Normal  Mood:  Anxious  Affect:  Blunt  Thought Process:  Coherent  Orientation:  Full (Time, Place, and Person)  Thought Content:  Logical  Suicidal Thoughts:  No  Homicidal Thoughts:  No  Memory:  Immediate;   Fair  Judgement:  Fair  Insight:  Lacking  Psychomotor Activity:  Normal  Concentration:  Concentration: Fair  Recall:  FiservFair  Fund of Knowledge:  Fair  Language:  Fair  Akathisia:  No  Handed:  Right  AIMS (if indicated):     Assets:  Communication Skills Desire for Improvement Social Support  ADL's:  Impaired  Cognition:  WNL  Sleep:        Demographic Factors:  Caucasian  Loss Factors: loss of housing   Historical Factors: Family history of mental illness or substance abuse and Impulsivity  Risk Reduction Factors:   Living with another person, especially a relative, Positive social support and Positive therapeutic relationship  Continued Clinical Symptoms:  Previous Psychiatric Diagnoses and Treatments  Cognitive Features  That Contribute To Risk:  None    Suicide Risk:  Minimal: No identifiable suicidal ideation.  Patients presenting with no risk factors but with morbid ruminations; may be classified as minimal risk based on the severity of the depressive symptoms   Plan Of Care/Follow-up recommendations:  Schizoaffective bipolar type: -Continue Trileptal 300 mg twice daily -Continue lithium 450 mg twice daily -Increase Haldol 2 mg twice daily to 5 mg twice daily -Follow-up with outpatient provider  EPS: -Start Cogentin 1 mg daily  Insomnia: -Continue Seroquel 50 mg as needed -Continue trazodone 50 mg as needed  Depression: -Continue Paxil 10 mg daily  Activity:  Reume activities as tolerated Diet:  reume diet per PCP  Disposition: Patient is cleared psychiatrically to discharge.   Nanine MeansJamison Lord, NP 02/06/2019, 2:54 PM

## 2019-02-06 NOTE — ED Notes (Signed)
Pt ambulating to restroom with walker.

## 2019-02-06 NOTE — ED Notes (Signed)
DSS called to attempt reporting the group home due to refusing to take patient back.

## 2019-02-06 NOTE — ED Notes (Signed)
Paul Half (legal guardian) [phone] notified of pt being refused to return group on.

## 2019-02-06 NOTE — ED Notes (Signed)
APS report made. Social worker states that group home not allowed to put people out without a warning. Social worker also thinks pt was originally placed by [facility]. If this is the case, report will need to be made with that county. Social worker to call this RN back with information.

## 2019-02-06 NOTE — ED Provider Notes (Signed)
-----------------------------------------   5:52 AM on 02/06/2019 -----------------------------------------   Blood pressure 120/80, pulse 82, temperature 99.3 F (37.4 C), temperature source Oral, resp. rate 16, weight 66.2 kg, SpO2 98 %.  The patient is sleeping at this time.  There have been no acute events since the last update.  Awaiting disposition plan from Behavioral Medicine and/or Social Work team(s).   Paulette Blanch, MD 02/06/19 [phone]

## 2019-02-06 NOTE — ED Notes (Signed)
ED Provider at bedside. 

## 2019-02-06 NOTE — ED Notes (Addendum)
Pt pleasant, cooperative, and calm. Pt denies any SI/HI today. Pt reports sleeping well. Pt answering in full, complete sentences. No evidence of hearing or seeing anything.

## 2019-02-06 NOTE — Progress Notes (Signed)
Phone call to [place], [phone] spoke Marlette who confirms that patient is not allowed back to the group home due to his aggressive behaviors (throwing things, hitting and spitting on other residents).   Phone call to [facility] for the [place] services spoke with the guardian on call Paul Half [phone]  who is aware that patient cannot return to the group home. He will notify patient's guardian Sinclair Grooms who will begin looking for a new placement for patient tomorrow. Per Paul Half, he does not have a direct contact number for [name] to provide at this time but will provide [name] with contact information for the hospital.  Social work to continue to follow to assist with discharge needs.   [place], [place] Social Work [phone]

## 2019-02-06 NOTE — ED Notes (Signed)
Pt given lunch tray. Pt eating.

## 2019-02-06 NOTE — ED Notes (Signed)
Pt given ice water at this time.  

## 2019-02-06 NOTE — ED Notes (Signed)
Pt brought to 31A, given TV remote, lights dimmed.  Pt denies any needs at this time.

## 2019-02-06 NOTE — ED Notes (Signed)
Pt given breakfast tray. Pt eating. 

## 2019-02-06 NOTE — ED Notes (Addendum)
Pt was originally placed by [facility]. This RN called [place] who stated that they would not be responsible due to pt group home being in [employer]. Social worker suggests calling group home manager in the morning to figure out what secondary placement they have secured.

## 2019-02-06 NOTE — BH Assessment (Signed)
Patient seen by Psych Nurse Practitioner Waylan Boga), he does not meet inpatient criteria. Patient guardian is aware he getting discharge. Per patient's nurse Yong Channel), he received a return phone call from patient's [place], stating he can not return. Writer advised him to speak with Social Work and updated Industrial/product designer.

## 2019-02-07 ENCOUNTER — Encounter: Payer: Self-pay | Admitting: Emergency Medicine

## 2019-02-07 ENCOUNTER — Other Ambulatory Visit: Payer: Self-pay

## 2019-02-07 MED ORDER — TUBERCULIN PPD 5 UNIT/0.1ML ID SOLN
5.0000 [IU] | Freq: Once | INTRADERMAL | Status: AC
  Administered 2019-02-09: 5 [IU] via INTRADERMAL
  Filled 2019-02-07 (×2): qty 0.1

## 2019-02-07 NOTE — ED Notes (Signed)
Pt up to bathroom, walks with walker. Back in bed now and denies further needs.

## 2019-02-07 NOTE — ED Notes (Signed)
Pt given sprite at this time. Pt resting comfortably in bed and watching TV at this time.

## 2019-02-07 NOTE — ED Notes (Signed)
Pt given cup of coffee. Pt ambulatory to bathroom with walker with this RN as standby assist. Pt back to bed without incident.

## 2019-02-07 NOTE — ED Notes (Signed)
Pt visualized resting in bed. NAD noted at this time. Pt able to self adjust in bed without difficulty. Will continue to monitor for further patient needs.

## 2019-02-07 NOTE — ED Notes (Signed)
Medication administered per MD order. Pt visualized in NAD at this time. Pt resting in bed, watching TV. Will continue to monitor for further patient needs.

## 2019-02-07 NOTE — ED Notes (Signed)
Gave pt a cup of coffee per [employer].

## 2019-02-07 NOTE — NC FL2 (Signed)
[place] LEVEL OF CARE SCREENING TOOL     IDENTIFICATION  Patient Name: Miguel Henry Birthdate: Jan 09, 1971 Sex: male Admission Date (Current Location): 02/05/2019  [place] and [place] Number:  Engineering geologist and Address:  [facility], [address] [place], [place] [address]      Provider Number: [id number]  Attending Physician Name and Address:  No att. providers found  Relative Name and Phone Number:  Legal Patrick North      [phone]    Current Level of Care: Hospital Recommended Level of Care: Other (Comment)(Group Home) Prior Approval Number:    Date Approved/Denied:   PASRR Number:    Discharge Plan: Other (Comment)(Group Home)    Current Diagnoses: Patient Active Problem List   Diagnosis Date Noted  . 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 Schizoaffective disorder ([place]) 09/11/2018  . Suicidal ideation     Orientation RESPIRATION BLADDER Height & Weight     Self, Time, Situation, Place  Normal Continent Weight: 145 lb 15.1 oz (66.2 kg) Height:     BEHAVIORAL SYMPTOMS/MOOD NEUROLOGICAL BOWEL NUTRITION STATUS      Continent Diet  AMBULATORY STATUS COMMUNICATION OF NEEDS Skin   Independent Verbally Normal                       Personal Care Assistance Level of Assistance  Bathing, Dressing, Feeding Bathing Assistance: Independent Feeding assistance: Independent Dressing Assistance: Independent     Functional Limitations Info  Sight, Hearing, Speech Sight Info: Adequate Hearing Info: Adequate Speech Info: Adequate    SPECIAL CARE FACTORS FREQUENCY                       Contractures Contractures Info: Not present    Additional Factors Info  Code Status, Allergies Code Status Info: FULL Allergies Info: No Known Allergies           Current Medications (02/07/2019):  This is the  current hospital active medication list Current Facility-Administered Medications  Medication Dose Route Frequency Provider Last Rate Last Dose  . benztropine (COGENTIN) tablet 1 mg  1 mg Oral Daily Patrecia Pour, NP   1 mg at 02/07/19 7619  . haloperidol (HALDOL) tablet 5 mg  5 mg Oral BID Patrecia Pour, NP   5 mg at 02/07/19 0933  . lithium carbonate (ESKALITH) CR tablet 450 mg  450 mg Oral Q12H Patrecia Pour, NP   450 mg at 02/07/19 0933  . loratadine (CLARITIN) tablet 10 mg  10 mg Oral Daily Patrecia Pour, NP   10 mg at 02/07/19 0932  . Melatonin TABS 5 mg  5 mg Oral QHS Patrecia Pour, NP   5 mg at 02/06/19 2236  . Oxcarbazepine (TRILEPTAL) tablet 300 mg  300 mg Oral BID Patrecia Pour, NP   300 mg at 02/07/19 0933  . PARoxetine (PAXIL) tablet 10 mg  10 mg Oral Daily Patrecia Pour, NP   10 mg at 02/07/19 1003  . QUEtiapine (SEROQUEL) tablet 50 mg  50 mg Oral QHS PRN Patrecia Pour, NP      . traZODone (DESYREL) tablet 50 mg  50 mg Oral QHS PRN Patrecia Pour, NP      . Derrill Memo ON 02/09/2019] tuberculin injection 5 Units  5 Units Intradermal Once Lavonia Drafts, MD  Current Outpatient Medications  Medication Sig Dispense Refill  . haloperidol (HALDOL) 2 MG tablet Take 1 tablet (2 mg total) by mouth 2 (two) times daily. 60 tablet 1  . haloperidol decanoate (HALDOL DECANOATE) 100 MG/ML injection Inject 0.5 mLs (50 mg total) into the muscle every 30 (thirty) days. 1 mL 1  . lithium carbonate (ESKALITH) 450 MG CR tablet Take 1 tablet (450 mg total) by mouth every 12 (twelve) hours. 60 tablet 1  . loratadine (CLARITIN) 10 MG tablet Take 1 tablet (10 mg total) by mouth daily. 30 tablet 1  . Melatonin 5 MG TABS Take 1 tablet (5 mg total) by mouth at bedtime. 30 each 1  . Oxcarbazepine (TRILEPTAL) 300 MG tablet Take 1 tablet (300 mg total) by mouth 2 (two) times daily. 60 tablet 1  . QUEtiapine (SEROQUEL) 25 MG tablet Take 2 tablets (50 mg total) by mouth at bedtime as needed  (agitation). 30 tablet 1  . traZODone (DESYREL) 50 MG tablet Take 1 tablet (50 mg total) by mouth at bedtime as needed for sleep. 30 tablet 1  . PARoxetine (PAXIL) 10 MG tablet Take 10 mg by mouth daily.       Discharge Medications: Please see discharge summary for a list of discharge medications.  Relevant Imaging Results:  Relevant Lab Results:   Additional Information SSN:   [id number]  Tania Adler Chartrand, LCSW

## 2019-02-07 NOTE — ED Notes (Signed)
Pt taken to shower by this RN and Threasa Beards, EDT. Pt currently in shower with Threasa Beards, EDT.

## 2019-02-07 NOTE — ED Notes (Signed)
Pt took shower,changed clothes, and RN Megan changed linens.

## 2019-02-07 NOTE — ED Notes (Signed)
Pt up to toilet without difficulty.

## 2019-02-07 NOTE — ED Notes (Signed)
Pt ate approx 60% of meal tray. Pt assisted to the bathroom by this RN as a standby assist. Pt back to bed without incident. Pt tolerated well.

## 2019-02-07 NOTE — ED Notes (Signed)
Pt ate 90% of breakfast tray. Pt currently sitting up in bed watching TV. NAD noted at this time. Pt showered and changed into clean blue paper scrubs and socks at this time. Pt refused oral care during his shower. Pt is alert, denies any needs. Pt is smiling and appears happy and interacting appropriately with staff.

## 2019-02-07 NOTE — ED Notes (Signed)
Patient ambulatory by self with walker to bathroom at this time.

## 2019-02-07 NOTE — ED Notes (Signed)
This RN to bedside, pt ambulated to the bathroom at this time with this RN as standby assist. Pt back to bed without incident. VS obtained. Pt tolerated well. This RN asked patient about shower, pt agreeable to taking a shower today. Will coordinate patient taking a shower and linen change. Pt alert and oriented, NAD noted at this time.

## 2019-02-07 NOTE — ED Notes (Signed)
Patient continues to sleep well. Arouses to movement in the room. Will continue to monitor.

## 2019-02-07 NOTE — ED Notes (Signed)
Pt resting in bed at this time, NAD noted, respirations even and unlabored at this time. Will continue to monitor for further patient needs.

## 2019-02-07 NOTE — ED Notes (Signed)
This RN spoke with CSW, per CSW she spoke with legal guardian, pt will be placed on Friday and he [facility] Karlene Lineman) will be here to pick patient up at 0900. Dr. Corky Downs placed orders for Covid Swab and PPD to be done at 0800 on 10/21 so that PPD can be read prior to discharge on Friday morning at 0900.

## 2019-02-07 NOTE — ED Notes (Signed)
This RN discussed with Dr. Jimmye Norman regarding concerns over patient's difficulty walking this morning compared to yesterday afternoon when this RN had patient, PT consult ordered at this time.

## 2019-02-07 NOTE — ED Notes (Signed)
Patient remains soundly asleep. Will continue to check on patient and assist to bathroom when patient awakens.

## 2019-02-07 NOTE — ED Notes (Signed)
Patient continues to sleep soundly. Lights down in the area for patient comfort.  
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
## 2019-02-07 NOTE — ED Notes (Signed)
PT at bedside at this time 

## 2019-02-07 NOTE — ED Notes (Signed)
Patient up to go to the bathroom. Patient ambulatory with assistance of walker. Denies need for help but walked along side of patient for added safety. Patient ambulatory back to room and back into bed unassisted. Denies need for anything at this time.

## 2019-02-07 NOTE — ED Notes (Signed)
Hand sanitizer provided to patient, pt provided with meal tray and a sprite per his request for lunch. Pt continues to rest in bed with NAD noted, watching TV. VSS. Will continue to monitor for further patient needs, continuing to await confirmation of placement from SW.

## 2019-02-07 NOTE — Social Work (Addendum)
64 CSW attempted to contact patient's legal guardian, Sinclair Grooms at [phone]. Voicemail left.   [place] Patient's legal guardian shared with CSW that he has secured placement and that he will be here to transport patient this Thursday at [facility] shared he will need updated FL2, COVID swab, and PPD. Dr. Corky Downs has ordered PPD, and COVID swab to be done Wednesday morning, and read on Friday morning due to results needing to be completeed within 48 hours.  Patient's nurse notified. Charge nurse notified.   CSW will update FL2.   [place], [place] [facility]  [phone]

## 2019-02-07 NOTE — ED Notes (Signed)
Patient given an additional warm blanket. Awakens when blanket placed on him. Denies need for anything at this time. Night light out in room for patient comfort.

## 2019-02-07 NOTE — ED Notes (Signed)
Pt resting in bed at this time, eyes closed, pt RR even and unlabored with equal rise and fall of chest. Call bell at bedside, bed low to floor. Will continue to monitor. 

## 2019-02-08 NOTE — ED Notes (Signed)
Pt given food tray.

## 2019-02-08 NOTE — ED Notes (Signed)
Patient given cup of coffee per his request. Denies any other needs at this time. Call light within reach. Will continue to monitor.

## 2019-02-08 NOTE — ED Notes (Signed)
Patient resting in bed at this time. Even and non labored respirations noted. Will continue to monitor.

## 2019-02-08 NOTE — ED Notes (Signed)
Patient resting on hospital bed. HOB elevated 30 degrees. Eyes are closed, even and non labored respirations noted. Call light within reach. Will continue to monitor.

## 2019-02-08 NOTE — ED Notes (Signed)
Updated on plan of care. Denies needs. Call light within reach. Will continue to monitor.

## 2019-02-08 NOTE — ED Notes (Signed)
Pt given sprite to drink. 

## 2019-02-09 LAB — SARS CORONAVIRUS 2 (TAT 6-24 HRS): SARS Coronavirus 2: NEGATIVE

## 2019-02-09 NOTE — ED Notes (Signed)
Checked in on patient, he is sleeping. Observed steady respirations and no signs of distress.

## 2019-02-09 NOTE — ED Provider Notes (Signed)
-----------------------------------------   10:21 AM on 02/09/2019 -----------------------------------------   Blood pressure 103/70, pulse 64, temperature 98 F (36.7 C), temperature source Oral, resp. rate 18, weight 66.2 kg, SpO2 98 %.  The patient is calm and cooperative at this time.  There have been no acute events since the last update.  Awaiting disposition plan from Behavioral Medicine and/or Social Work team(s).    Merlyn Lot, MD 02/09/19 1021

## 2019-02-09 NOTE — ED Notes (Signed)
Patient provided with Sprite to drink, no other needs at present. Call bell within reach

## 2019-02-09 NOTE — ED Notes (Signed)
Pt given breakfast tray. Ice water and coffee at this time

## 2019-02-09 NOTE — ED Notes (Signed)
Pt sleeping. 

## 2019-02-09 NOTE — ED Notes (Signed)
Patient resting peacefully at this time. Urinal emptied and replaced at bedside.

## 2019-02-09 NOTE — ED Notes (Signed)
Pt alert  Watching tv.

## 2019-02-10 NOTE — ED Notes (Signed)
Patient out of bed to do PT with physical therapy.

## 2019-02-10 NOTE — Social Work (Signed)
Patient's guardian, Sinclair Grooms, will be picking patient up at 9am to go to a new group home in [place], [place]. Guardian will need FL2, COVID results, and PPD results sent with patient.  Patient's RN notified.     [place], [place] [facility]  [phone]

## 2019-02-10 NOTE — ED Notes (Signed)
Pt laying in bed with lights off- call bell within reach

## 2019-02-10 NOTE — ED Provider Notes (Signed)
-----------------------------------------   7:43 AM on 02/10/2019 -----------------------------------------   Blood pressure 97/63, pulse (!) 52, temperature 98.7 F (37.1 C), temperature source Oral, resp. rate 18, weight 66.2 kg, SpO2 96 %.  The patient is calm and cooperative at this time.  There have been no acute events since the last update.  Awaiting disposition plan from Behavioral Medicine and/or Social Work team(s).    Merlyn Lot, MD 02/10/19 [phone]

## 2019-02-10 NOTE — ED Notes (Signed)
Report given to Jeannette, RN 

## 2019-02-10 NOTE — ED Notes (Signed)
Resting comfortably, patient 100% of his lunch. Safety maintained will monitor.
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
## 2019-02-10 NOTE — ED Notes (Signed)
Assumed care of patient, denies any concerns or pain. Aware that he will be leaving ed to new group home tommorow. Patient reports not sure of the name to new group home. States his family is taking care of that. Patient calm cooperative, vss safety maintained.

## 2019-02-10 NOTE — ED Notes (Signed)
Dinner tray provided. Patient ate 100%. Sitting up in bed watching tv. Safety maintained will monitor.

## 2019-02-10 NOTE — ED Notes (Signed)
Report off to vanessa rn 

## 2019-02-10 NOTE — ED Notes (Signed)
Pt sleeping. 

## 2019-02-10 NOTE — ED Notes (Signed)
Pt continues to rest in bed with lights off

## 2019-02-10 NOTE — ED Notes (Signed)
Pt sleeping in hospital bed with lights off

## 2019-02-10 NOTE — ED Notes (Signed)
Pt awake- lights turned on and meal tray given

## 2019-02-11 NOTE — ED Notes (Signed)
Report received from end-shift RN. Patient care assumed. Patient/RN introduction complete. Will continue to monitor. Pt awaiting placement, pt up to use urinal without difficulty. Pt co right foot pain states "I have a bunion". No other complaints at this time, is calm and cooperative at this time.

## 2019-02-11 NOTE — ED Notes (Signed)
Inspected left forearm for induration- 1 mm high and 3 mm in diameter

## 2019-02-11 NOTE — ED Notes (Signed)
Pt up to use urinal and then laying back in bed with eyes closed

## 2019-02-11 NOTE — Progress Notes (Signed)
Patient's legal guardian called and stated that he is on his way to pick patient up. Should arrive around 9:10a.  [place], LCSW Transitions of Care Department [facility] ED [phone]

## 2019-02-11 NOTE — ED Notes (Addendum)
Pt belongings returned- pt did not sign d/t mental disability- paperwork reviewed with legal guardian

## 2019-02-11 NOTE — ED Notes (Signed)
Pt resting comfortably at this time without evidence or pain or discomfort.

## 2019-02-19 ENCOUNTER — Emergency Department
Admission: EM | Admit: 2019-02-19 | Discharge: 2019-02-20 | Disposition: A | Discharge status: Home / Self Care | Payer: Medicare Other | Attending: Emergency Medicine | Admitting: Emergency Medicine

## 2019-02-19 DIAGNOSIS — R451 Restlessness and agitation: Secondary | ICD-10-CM | POA: Diagnosis present

## 2019-02-19 DIAGNOSIS — F29 Unspecified psychosis not due to a substance or known physiological condition: Secondary | ICD-10-CM | POA: Diagnosis not present

## 2019-02-19 DIAGNOSIS — F17210 Nicotine dependence, cigarettes, uncomplicated: Secondary | ICD-10-CM | POA: Insufficient documentation

## 2019-02-19 DIAGNOSIS — Z046 Encounter for general psychiatric examination, requested by authority: Secondary | ICD-10-CM | POA: Insufficient documentation

## 2019-02-19 DIAGNOSIS — Z79899 Other long term (current) drug therapy: Secondary | ICD-10-CM | POA: Insufficient documentation

## 2019-02-19 DIAGNOSIS — G4700 Insomnia, unspecified: Secondary | ICD-10-CM | POA: Insufficient documentation

## 2019-02-19 DIAGNOSIS — Z9114 Patient's other noncompliance with medication regimen: Secondary | ICD-10-CM | POA: Diagnosis not present

## 2019-02-19 DIAGNOSIS — Z21 Asymptomatic human immunodeficiency virus [HIV] infection status: Secondary | ICD-10-CM | POA: Diagnosis not present

## 2019-02-19 DIAGNOSIS — F319 Bipolar disorder, unspecified: Secondary | ICD-10-CM | POA: Diagnosis not present

## 2019-02-19 DIAGNOSIS — F250 Schizoaffective disorder, bipolar type: Secondary | ICD-10-CM | POA: Diagnosis not present

## 2019-02-19 LAB — COMPREHENSIVE METABOLIC PANEL
ALT: 16 U/L (ref 0–44)
AST: 18 U/L (ref 15–41)
Albumin: 4.3 g/dL (ref 3.5–5.0)
Alkaline Phosphatase: 76 U/L (ref 38–126)
Anion gap: 9 (ref 5–15)
BUN: 13 mg/dL (ref 6–20)
CO2: 25 mmol/L (ref 22–32)
Calcium: 9.9 mg/dL (ref 8.9–10.3)
Chloride: 107 mmol/L (ref 98–111)
Creatinine, Ser: 0.79 mg/dL (ref 0.61–1.24)
GFR calc Af Amer: >60 mL/min (ref >60)
GFR calc non Af Amer: >60 mL/min (ref >60)
Glucose, Bld: 120 mg/dL — ABNORMAL HIGH (ref 70–99)
Potassium: 3.9 mmol/L (ref 3.5–5.1)
Sodium: 141 mmol/L (ref 135–145)
Total Bilirubin: 0.4 mg/dL (ref 0.3–1.2)
Total Protein: 7.3 g/dL (ref 6.5–8.1)

## 2019-02-19 LAB — URINE DRUG SCREEN, QUALITATIVE (ARMC ONLY)
Amphetamines, Ur Screen: NOT DETECTED
Barbiturates, Ur Screen: NOT DETECTED
Benzodiazepine, Ur Scrn: NOT DETECTED
Cannabinoid 50 Ng, Ur ~~LOC~~: NOT DETECTED
Cocaine Metabolite,Ur ~~LOC~~: NOT DETECTED
MDMA (Ecstasy)Ur Screen: NOT DETECTED
Methadone Scn, Ur: NOT DETECTED
Opiate, Ur Screen: NOT DETECTED
Phencyclidine (PCP) Ur S: NOT DETECTED
Tricyclic, Ur Screen: NOT DETECTED

## 2019-02-19 LAB — CBC WITH DIFFERENTIAL/PLATELET
Abs Immature Granulocytes: 0.02 10*3/uL (ref 0.00–0.07)
Basophils Absolute: 0.1 10*3/uL (ref 0.0–0.1)
Basophils Relative: 1 %
Eosinophils Absolute: 0.4 10*3/uL (ref 0.0–0.5)
Eosinophils Relative: 4 %
HCT: 46.1 % (ref 39.0–52.0)
Hemoglobin: 15.4 g/dL (ref 13.0–17.0)
Immature Granulocytes: 0 %
Lymphocytes Relative: 29 %
Lymphs Abs: 2.9 10*3/uL (ref 0.7–4.0)
MCH: 31.2 pg (ref 26.0–34.0)
MCHC: 33.4 g/dL (ref 30.0–36.0)
MCV: 93.3 fL (ref 80.0–100.0)
Monocytes Absolute: 0.8 10*3/uL (ref 0.1–1.0)
Monocytes Relative: 8 %
Neutro Abs: 5.8 10*3/uL (ref 1.7–7.7)
Neutrophils Relative %: 58 %
Platelets: 220 10*3/uL (ref 150–400)
RBC: 4.94 MIL/uL (ref 4.22–5.81)
RDW: 13.2 % (ref 11.5–15.5)
WBC: 10.1 10*3/uL (ref 4.0–10.5)
nRBC: 0 % (ref 0.0–0.2)

## 2019-02-19 LAB — URINALYSIS, COMPLETE (UACMP) WITH MICROSCOPIC
Bacteria, UA: NONE SEEN
Bilirubin Urine: NEGATIVE
Glucose, UA: NEGATIVE mg/dL
Hgb urine dipstick: NEGATIVE
Ketones, ur: NEGATIVE mg/dL
Leukocytes,Ua: NEGATIVE
Nitrite: NEGATIVE
Protein, ur: NEGATIVE mg/dL
Specific Gravity, Urine: 1.005 (ref 1.005–1.030)
Squamous Epithelial / HPF: NONE SEEN (ref 0–5)
pH: 7 (ref 5.0–8.0)

## 2019-02-19 LAB — LITHIUM LEVEL: Lithium Lvl: 0.52 mmol/L — ABNORMAL LOW (ref 0.60–1.20)

## 2019-02-19 MED ORDER — MELATONIN 5 MG PO TABS
5.0000 mg | ORAL_TABLET | Freq: Every day | ORAL | Status: DC
  Administered 2019-02-19: 5 mg via ORAL
  Filled 2019-02-19 (×2): qty 1

## 2019-02-19 MED ORDER — MELATONIN 5 MG PO TABS: 5.0000 mg | ORAL_TABLET | Freq: Every day | ORAL | Status: DC

## 2019-02-19 MED ORDER — HALOPERIDOL 2 MG PO TABS
2.0000 mg | ORAL_TABLET | Freq: Two times a day (BID) | ORAL | Status: DC
  Administered 2019-02-19 – 2019-02-20 (×2): 2 mg via ORAL
  Filled 2019-02-19 (×3): qty 1

## 2019-02-19 MED ORDER — TRAZODONE HCL 50 MG PO TABS: 50.0000 mg | ORAL_TABLET | Freq: Every evening | ORAL | Status: DC | PRN

## 2019-02-19 MED ORDER — LITHIUM CARBONATE ER 450 MG PO TBCR
450.0000 mg | EXTENDED_RELEASE_TABLET | Freq: Two times a day (BID) | ORAL | Status: DC
  Administered 2019-02-19 – 2019-02-20 (×2): 450 mg via ORAL
  Filled 2019-02-19 (×3): qty 1

## 2019-02-19 MED ORDER — TRAZODONE HCL 50 MG PO TABS
50.0000 mg | ORAL_TABLET | Freq: Every day | ORAL | Status: DC
  Administered 2019-02-19: 50 mg via ORAL
  Filled 2019-02-19: qty 1

## 2019-02-19 MED ORDER — QUETIAPINE FUMARATE 25 MG PO TABS: 50.0000 mg | ORAL_TABLET | Freq: Every evening | ORAL | Status: DC | PRN

## 2019-02-19 MED ORDER — PAROXETINE HCL 10 MG PO TABS
10.0000 mg | ORAL_TABLET | Freq: Every day | ORAL | Status: DC
  Administered 2019-02-20: 10 mg via ORAL
  Filled 2019-02-19 (×3): qty 1

## 2019-02-19 MED ORDER — LORATADINE 10 MG PO TABS
10.0000 mg | ORAL_TABLET | Freq: Every day | ORAL | Status: DC
  Administered 2019-02-20: 10 mg via ORAL
  Filled 2019-02-19: qty 1

## 2019-02-19 MED ORDER — OXCARBAZEPINE 300 MG PO TABS
300.0000 mg | ORAL_TABLET | Freq: Two times a day (BID) | ORAL | Status: DC
  Administered 2019-02-19 – 2019-02-20 (×2): 300 mg via ORAL
  Filled 2019-02-19 (×2): qty 1

## 2019-02-19 NOTE — BH Assessment (Signed)
Clinician contacted the personnel on-call at pt's guardian's number, [facility]. Clinician spoke to Isaias Sakai, who is currently on-call for pt's guardian, [name]. Suzanna shared there was little documentation in regards to pt or his medication so could not provide any information. Suzanna requested a returned phone call when a disposition has been made.  [place]: [place]: [phone]

## 2019-02-19 NOTE — ED Notes (Signed)
Patients legal guardian is [phone].  No answer, machine gives crisis # to call after [phone].

## 2019-02-19 NOTE — BH Assessment (Signed)
Clinician contacted the Owner/Director of [facility], the current placement for pt, [name]. [name] shared that pt came to the group home approximately last Friday (February 11, 2019) from the group home [facility], located in [place], and that when pt arrived, via transportation of his guardian, [name], pt had only 2-3 days worth of medication, did not have his Medicaid card, and no clothing. [name] stated she has been to the pharmacy 3 times in an attempt to fill pt's prescriptions and went to pt's old group home in person yesterday but she has been unable to get pt's prescriptions re-filled and has not be able to get/find pt's Medicaid card; thus, pt has not had his medication in several days. [name] states the old group home states they do not have pt's Medicaid card, so it's unknown, at this time, where the card is.   [name] stated they brought pt to the ED tonight due to concerns that pt will go through w/ds due to him not being on his medication, one of which is a seizure medication, and they are concerned about his health and well-being. She states pt has given them no problems and that he is well-behaved; they simply are seeking out medication to ensure his needs are being met.  [name] stated her daughter, [name], the Manager of [employer], could be contacted with any further information or with any additional questions.  [name], Freight forwarder, [facility]: [phone]

## 2019-02-19 NOTE — BH Assessment (Addendum)
Assessment Note  Miguel Henry is a 48 y.o. male who was brought to [facility] ED by his group home due to pt not being able to sleep since yesterday and due to not being on his medication for several days. Clinician contacted pt's legal guardian, though the on-call personnel was unable to provide much information. Clinician contacted the group home owner, who stated that pt came to the group home late last week and did not have his Medicaid card with him when he arrived. The group home owner stated pt only arrived with several days worth of medication and that, due to a lack of a Medicaid card, they were unable to refill pt's medications. The group home owner stated she went to the pharmacy 3 times and returned to pt's old group home in-person and the pharmacy continued to refuse to refill pt's medications and pt's group home stated they did not have pt's Medicaid card. Thus, pt continues to go without his medications. So, with pt's inability to sleep, and concerns about the long-term effect of pt going without his medication, the group home brought pt to the ED tonight with the hopes of pt getting back on his medication.  Pt denies SI, HI, AVH, NSSIB, access to guns/weapons, and SA. Pt shares he is unsure if he is on probation, stating there were some going-ons with the court and probation at his last group home. Pt shares he has a hx of SI and attempts to kill himself, as well as mental health hospitalizations, years and years ago, though it was long enough ago that he cannot remember when it was.  Pt shares he enjoys his new group home and that he has no complaints about it; he states he likes it much more than his prior group home, sharing the employees there talk and interact with him and that the food is good. The owner of the group home states they have enjoyed having pt in their group home and him being brought to the ED is solely to attempt to get pt his meds as quickly as possible, as with COVID they  are unsure how quickly they can get pt an appt with a psychiatrist/provider.  Pt was made aware that his legal guardian was contacted and gave verbal consent for the group home to be contacted on his behalf.   Diagnosis: F31.9, Bipolar I disorder, Current or most recent episode unspecified   Past Medical History:  Past Medical History:  Diagnosis Date  . Bipolar 1 disorder (HCC)   . HIV (human immunodeficiency virus infection) (HCC)   . Schizophrenic disorder (HCC)   . Tobacco abuse     No past surgical history on file.  Family History: No family history on file.  Social History:  reports that he has been smoking cigarettes. He has quit using smokeless tobacco. He reports current alcohol use. He reports previous drug use. Drugs: Marijuana and Cocaine.  Additional Social History:  Alcohol / Drug Use Pain Medications: Please see MAR Prescriptions: Please see MAR Over the Counter: Please see MAR History of alcohol / drug use?: No history of alcohol / drug abuse Longest period of sobriety (when/how long): Unknown - pt denies SA  CIWA: CIWA-Ar BP: (!) 134/93 Pulse Rate: 90 COWS:    Allergies: No Known Allergies  Home Medications: (Not in a hospital admission)   OB/GYN Status:  No LMP for male patient.  General Assessment Data [facility] TTS Assessment: In system Is this a Tele  or Face-to-Face Assessment?: Face-to-Face Is this an Initial Assessment or a Re-assessment for this encounter?: Initial Assessment Patient Accompanied by:: N/A Language Other than English: No Living Arrangements: In Group Home: (Comment: Name of Group Home)(Pt currently resides at [facility] in [place], [place]) What gender do you identify as?: Male Marital status: Divorced Miguel Henry name: Miguel Henry Pregnancy Status: No Living Arrangements: Group Home Can pt return to current living arrangement?: Yes Admission Status: Voluntary Is patient capable of signing voluntary  admission?: Yes Referral Source: Other(Group Home) Insurance type: Medicare     Crisis Care Plan Living Arrangements: Group Home Legal Guardian: Other:(Miguel Henry - [facility]: [phone]) Name of Psychiatrist: None (or pt doesn't know) Name of Therapist: None (or pt doesn't know)  Education Status Is patient currently in school?: No Is the patient employed, unemployed or receiving disability?: Receiving disability income  Risk to self with the past 6 months Suicidal Ideation: No Has patient been a risk to self within the past 6 months prior to admission? : No Suicidal Intent: No Has patient had any suicidal intent within the past 6 months prior to admission? : No Is patient at risk for suicide?: No Suicidal Plan?: No Has patient had any suicidal plan within the past 6 months prior to admission? : No Access to Means: No What has been your use of drugs/alcohol within the last 12 months?: Pt denies SA Previous Attempts/Gestures: Yes How many times?: (Pt unsure - several times many years ago) Other Self Harm Risks: None noted Triggers for Past Attempts: Unknown Intentional Self Injurious Behavior: None Family Suicide History: No Recent stressful life event(s): Other (Comment), Loss (Comment)(Recently moved to new group home - this was a positive move) Persecutory voices/beliefs?: No Depression: No Depression Symptoms: Insomnia Substance abuse history and/or treatment for substance abuse?: No(Pt denies) Suicide prevention information given to non-admitted patients: Not applicable  Risk to Others within the past 6 months Homicidal Ideation: No Does patient have any lifetime risk of violence toward others beyond the six months prior to admission? : No Thoughts of Harm to Others: No Current Homicidal Intent: No Current Homicidal Plan: No Access to Homicidal Means: No Identified Victim: None noted History of harm to others?: No Assessment of Violence: None  Noted Violent Behavior Description: None noted Does patient have access to weapons?: No(Pt denies access to guns/weapons) Criminal Charges Pending?: No Does patient have a court date: No Is patient on probation?: Unknown(Pt states he is unsure if ihe is currently on probation)  Psychosis Hallucinations: None noted Delusions: None noted  Mental Status Report Appearance/Hygiene: Unremarkable Eye Contact: Good Motor Activity: Shuffling Speech: Slurred Level of Consciousness: Alert(States he feels "on-edge" after being up for several days) Mood: Anxious Affect: Appropriate to circumstance Anxiety Level: Minimal Thought Processes: Flight of Ideas Judgement: Partial Orientation: Person, Place, Time, Situation Obsessive Compulsive Thoughts/Behaviors: Minimal  Cognitive Functioning Concentration: Fair Memory: Recent Intact, Remote Intact Insight: Fair Impulse Control: Fair Appetite: Good Have you had any weight changes? : No Change Sleep: Decreased Total Hours of Sleep: (No sleep for 2 days; typically 4-5 hrs of sleep) Vegetative Symptoms: None  ADLScreening [facility] Assessment Services) Patient's cognitive ability adequate to safely complete daily activities?: Yes Patient able to express need for assistance with ADLs?: Yes Independently performs ADLs?: Yes (appropriate for developmental age)  Prior Inpatient Therapy Prior Inpatient Therapy: (UTA)  Prior Outpatient Therapy Prior Outpatient Therapy: (UTA)  ADL Screening (condition at time of admission) Patient's cognitive ability adequate to safely complete daily activities?:  Yes Is the patient deaf or have difficulty hearing?: No Does the patient have difficulty seeing, even when wearing glasses/contacts?: No Does the patient have difficulty concentrating, remembering, or making decisions?: No Patient able to express need for assistance with ADLs?: Yes Does the patient have difficulty dressing or bathing?: No Independently  performs ADLs?: Yes (appropriate for developmental age) Does the patient have difficulty walking or climbing stairs?: Yes Weakness of Legs: Both Weakness of Arms/Hands: None  Home Assistive Devices/Equipment Home Assistive Devices/Equipment: Environmental consultant (specify type)  Therapy Consults (therapy consults require a physician order) PT Evaluation Needed: No OT Evalulation Needed: No SLP Evaluation Needed: No Abuse/Neglect Assessment (Assessment to be complete while patient is alone) Abuse/Neglect Assessment Can Be Completed: Yes Physical Abuse: Denies Verbal Abuse: Yes, past (Comment), Yes, present (Comment)(Pt states that people make mean comments to him at times) Sexual Abuse: Denies Exploitation of patient/patient's resources: Denies Self-Neglect: Denies Values / Beliefs Cultural Requests During Hospitalization: None Spiritual Requests During Hospitalization: None Consults Spiritual Care Consult Needed: No Social Work Consult Needed: No Regulatory affairs officer (For Healthcare) Does Patient Have a Medical Advance Directive?: No Would patient like information on creating a medical advance directive?: No - Patient declined         Disposition: Clinician recommends pt be provided the medication had been prescribed by prior providers for a 30-days supply in an effort to maintain him until his guardian/group home manager are able to obtain his Medicaid card and, thus, fill his prescriptions independently.    Disposition Initial Assessment Completed for this Encounter: Yes Patient referred to: Other (Comment)(Pt will be admitted overnight for administration of meds)  On Site Evaluation by:   Reviewed with Physician:    Dannielle Burn 02/19/2019 10:28 PM

## 2019-02-19 NOTE — ED Provider Notes (Signed)
[facility] Emergency Department Provider Note   ____________________________________________   First MD Initiated Contact with Patient 02/19/19 2040     (approximate)  I have reviewed the triage vital signs and the nursing notes.   HISTORY  Chief Complaint Medical Clearance    HPI Miguel Henry is a 48 y.o. male who comes [facility].  Reportedly he has not been taking his medicines.  Patient himself reports they did not have the medicines to give him.  He says he has not slept last night or tonight.  Or anytime in between.  Last time he slept was the night of the 29th.  He denies any homicidal or suicidal ideation.         Past Medical History:  Diagnosis Date  . Bipolar 1 disorder (HCC)   . HIV (human immunodeficiency virus infection) (HCC)   . Schizophrenic disorder (HCC)   . Tobacco abuse     Patient Active Problem List   Diagnosis Date Noted  . 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 Schizoaffective disorder (HCC) 09/11/2018  . Suicidal ideation     No past surgical history on file.  Prior to Admission medications   Medication Sig Start Date End Date Taking? Authorizing Provider  haloperidol (HALDOL) 2 MG tablet Take 1 tablet (2 mg total) by mouth 2 (two) times daily. 09/13/18   Clapacs, Jackquline Denmark, MD  haloperidol decanoate (HALDOL DECANOATE) 100 MG/ML injection Inject 0.5 mLs (50 mg total) into the muscle every 30 (thirty) days. 09/13/18   Clapacs, Jackquline Denmark, MD  lithium carbonate (ESKALITH) 450 MG CR tablet Take 1 tablet (450 mg total) by mouth every 12 (twelve) hours. 09/13/18   Clapacs, Jackquline Denmark, MD  loratadine (CLARITIN) 10 MG tablet Take 1 tablet (10 mg total) by mouth daily. 09/14/18   Clapacs, Jackquline Denmark, MD  Melatonin 5 MG TABS Take 1 tablet (5 mg total) by mouth at bedtime. 09/13/18   Clapacs, Jackquline Denmark, MD  Oxcarbazepine (TRILEPTAL) 300 MG tablet  Take 1 tablet (300 mg total) by mouth 2 (two) times daily. 09/13/18   Clapacs, Jackquline Denmark, MD  PARoxetine (PAXIL) 10 MG tablet Take 10 mg by mouth daily.    [provider]  QUEtiapine (SEROQUEL) 25 MG tablet Take 2 tablets (50 mg total) by mouth at bedtime as needed (agitation). 09/13/18   Clapacs, Jackquline Denmark, MD  traZODone (DESYREL) 50 MG tablet Take 1 tablet (50 mg total) by mouth at bedtime as needed for sleep. 09/13/18   Clapacs, Jackquline Denmark, MD    Allergies Patient has no known allergies.  No family history on file.  Social History Social History   Tobacco Use  . Smoking status: Current Every Day Smoker    Types: Cigarettes  . Smokeless tobacco: Former Engineer, water Use Topics  . Alcohol use: Yes    Comment: when able to get some  . Drug use: Not Currently    Types: Marijuana, Cocaine    Review of Systems  Constitutional: No fever/chills Eyes: No visual changes. ENT: No sore throat. Cardiovascular: Denies chest pain. Respiratory: Denies shortness of breath. Gastrointestinal: No abdominal pain.  No nausea, no vomiting.  No diarrhea.  No constipation. Genitourinary: Negative for dysuria. Musculoskeletal: Negative for back pain. Skin: Negative for rash. Neurological: Negative for headaches, focal weakness  ____________________________________________   PHYSICAL EXAM:  VITAL SIGNS: ED Triage Vitals  Enc Vitals Group  BP 02/19/19 2001 (!) 134/93     Pulse Rate 02/19/19 2001 90     Resp 02/19/19 2001 18     Temp 02/19/19 2001 98.1 F (36.7 C)     Temp Source 02/19/19 2001 Oral     SpO2 02/19/19 2001 100 %     Weight 02/19/19 2002 160 lb (72.6 kg)     Height 02/19/19 2002 5\' 7"  (1.702 m)     Head Circumference --      Peak Flow --      Pain Score 02/19/19 2002 0     Pain Loc --      Pain Edu? --      Excl. in [place]? --     Constitutional: Alert and oriented. Well appearing and in no acute distress. Eyes: Conjunctivae are normal. PER. EOMI. Head: Atraumatic.  Nose: No congestion/rhinnorhea. Mouth/Throat: Mucous membranes are moist.  Oropharynx non-erythematous. Neck: No stridor.  Cardiovascular: Normal rate, regular rhythm. Grossly normal heart sounds.  Good peripheral circulation. Respiratory: Normal respiratory effort.  No retractions. Lungs CTAB. Gastrointestinal: Soft and nontender. No distention. No abdominal bruits. No CVA tenderness. Musculoskeletal: No lower extremity tenderness nor edema.   Neurologic:  Normal speech and language. No gross focal neurologic deficits are appreciated. Skin:  Skin is warm, dry and intact. No rash noted. Psychiatric: Mood and affect are normal. Speech and behavior are normal.  ____________________________________________   LABS (all labs ordered are listed, but only abnormal results are displayed)  Labs Reviewed  COMPREHENSIVE METABOLIC PANEL - Abnormal; Notable for the following components:      Result Value   Glucose, Bld 120 (*)    All other components within normal limits  URINALYSIS, COMPLETE (UACMP) WITH MICROSCOPIC - Abnormal; Notable for the following components:   Color, Urine STRAW (*)    APPearance CLEAR (*)    All other components within normal limits  LITHIUM LEVEL - Abnormal; Notable for the following components:   Lithium Lvl 0.52 (*)    All other components within normal limits  CBC WITH DIFFERENTIAL/PLATELET  URINE DRUG SCREEN, QUALITATIVE ([facility] ONLY)   ____________________________________________  EKG   ____________________________________________  RADIOLOGY    Official radiology report(s): No results found.  ____________________________________________   PROCEDURES  Procedure(s) performed (including Critical Care):  Procedures   ____________________________________________   INITIAL IMPRESSION / ASSESSMENT AND PLAN / ED COURSE  Turns out that the [facility].  [facility] in the [facility] she did not have  any of his medicines and he did not have his Medicaid card.  There is no way to get him his medicines at the care home currently.  We will plan on watching him here until the situation gets straightened out.           ____________________________________________   FINAL CLINICAL IMPRESSION(S) / ED DIAGNOSES  Final diagnoses:  Bipolar affective disorder, remission status unspecified [facility])     ED Discharge Orders    None       Note:  This document was prepared using Dragon voice recognition software and may include unintentional dictation errors.    Nena Polio, MD 02/19/19 2342

## 2019-02-19 NOTE — ED Notes (Signed)
Pt. States he has been staying at [facility] on [address].

## 2019-02-19 NOTE — ED Triage Notes (Signed)
Pt. Here from [facility].  Staff state pt. Has not been taking medications for the past couple days.

## 2019-02-19 NOTE — ED Notes (Signed)
Staff at group believe patient is not taking his medications and has become restless.  Pt. States not having medication for the past couple days and has been unable to sleep.  Pt. Is not SI or HI.

## 2019-02-20 DIAGNOSIS — F250 Schizoaffective disorder, bipolar type: Secondary | ICD-10-CM

## 2019-02-20 MED ORDER — TRAZODONE HCL 50 MG PO TABS: 50.0000 mg | ORAL_TABLET | Freq: Every evening | ORAL | 2 refills | Status: AC | PRN

## 2019-02-20 MED ORDER — HALOPERIDOL 2 MG PO TABS: ORAL_TABLET | ORAL | 3 refills | Status: AC

## 2019-02-20 MED ORDER — OXCARBAZEPINE 300 MG PO TABS: 300.0000 mg | ORAL_TABLET | Freq: Two times a day (BID) | ORAL | 2 refills | Status: DC

## 2019-02-20 MED ORDER — TRAZODONE HCL 50 MG PO TABS: 50.0000 mg | ORAL_TABLET | Freq: Every evening | ORAL | 1 refills | Status: DC | PRN

## 2019-02-20 MED ORDER — NICOTINE 21 MG/24HR TD PT24
21.0000 mg | MEDICATED_PATCH | Freq: Once | TRANSDERMAL | Status: DC
  Administered 2019-02-20: 21 mg via TRANSDERMAL

## 2019-02-20 MED ORDER — QUETIAPINE FUMARATE 25 MG PO TABS: 50.0000 mg | ORAL_TABLET | Freq: Every evening | ORAL | 2 refills | Status: AC | PRN

## 2019-02-20 MED ORDER — QUETIAPINE FUMARATE 25 MG PO TABS: 50.0000 mg | ORAL_TABLET | Freq: Every evening | ORAL | 1 refills | Status: DC | PRN

## 2019-02-20 MED ORDER — HALOPERIDOL 2 MG PO TABS: ORAL_TABLET | ORAL | 2 refills | Status: DC

## 2019-02-20 MED ORDER — LORATADINE 10 MG PO TABS: 10.0000 mg | ORAL_TABLET | Freq: Every day | ORAL | 1 refills | Status: DC

## 2019-02-20 MED ORDER — MELATONIN 5 MG PO TABS: 5.0000 mg | ORAL_TABLET | Freq: Every day | ORAL | 2 refills | Status: AC

## 2019-02-20 MED ORDER — LORATADINE 10 MG PO TABS: 10.0000 mg | ORAL_TABLET | Freq: Every day | ORAL | 2 refills | Status: AC

## 2019-02-20 MED ORDER — HALOPERIDOL DECANOATE 100 MG/ML IM SOLN: 50.0000 mg | INTRAMUSCULAR | 3 refills | Status: AC

## 2019-02-20 MED ORDER — LITHIUM CARBONATE ER 450 MG PO TBCR: 450.0000 mg | EXTENDED_RELEASE_TABLET | Freq: Two times a day (BID) | ORAL | 2 refills | Status: AC

## 2019-02-20 MED ORDER — OXCARBAZEPINE 300 MG PO TABS: 300.0000 mg | ORAL_TABLET | Freq: Two times a day (BID) | ORAL | 3 refills | Status: AC

## 2019-02-20 MED ORDER — PAROXETINE HCL 10 MG PO TABS: 10.0000 mg | ORAL_TABLET | Freq: Every day | ORAL | 2 refills | Status: DC

## 2019-02-20 MED ORDER — MELATONIN 5 MG PO TABS: 5.0000 mg | ORAL_TABLET | Freq: Every day | ORAL | 1 refills | Status: DC

## 2019-02-20 MED ORDER — PAROXETINE HCL 10 MG PO TABS: 10.0000 mg | ORAL_TABLET | Freq: Every day | ORAL | 3 refills | Status: AC

## 2019-02-20 NOTE — ED Notes (Signed)
Miguel Henry: [phone] was notified that [facility] called and Royston Cowper aware of patients discharge. Ok to send  Patient home in a cab and they will pay upon patients arrival.

## 2019-02-20 NOTE — ED Notes (Signed)
Assumed care of patient, contact number on record called Thayer Headings) guardian listed, as per secretary patient will not be [facility]. Called other 2 contact numbers left by current guardian Mr. Ricci Barker # [phone] was a number for crisis line whom stated they are located in [place], but provided a contact # [phone]  to call for mr. Rosario. No answer when calling this number. Also called # [phone] no answer at this number as well. TTS to try and contact someone to pick patient up. [facility] discharged [facility] to [facility].

## 2019-02-20 NOTE — ED Notes (Signed)
TTS was able to conta

## 2019-02-20 NOTE — Discharge Summary (Signed)
Physician Discharge Summary Note  Patient:  Miguel Henry is an 48 y.o., male MRN:  [id number] DOB:  1970/11/10 Patient phone:  [phone] (home)  Patient address:   [address] [place] [address],  Total Time spent with patient: 45 minutes  Date of Admission:  02/19/2019 Date of Discharge: 02/20/2019  Reason for Admission:    Mr. Miguel Henry is 48 years of age she understands he is been diagnosed with a bipolar type condition or a schizophrenic condition more accurately he has been diagnosed with schizoaffective disorder, bipolar type.  As discussed below, as he transition to a new group home his prescriptions fell through the cracks and he has been without his mood stabilizer/antipsychotic therapy to include quetiapine, Haldol, oxcarbazepine, and lithium.  He cannot name his specific doses but they are charted accurately as far as we can tell.  He is alert and oriented and cooperative without thoughts of harming self or others without acute hallucinations or mania.  He denies any positive symptoms of schizophrenia.  No EPS or TD.  Brief HPI from assessment team counselor of 10/31 Clinician contacted the Owner/Director of [facility], the current placement for pt, [name]. [name] shared that pt came to the group home approximately last Friday (February 11, 2019) from the group home [facility], located in [place], and that when pt arrived, via transportation of his guardian, Miguel Henry, pt had only 2-3 days worth of medication, did not have his Medicaid card, and no clothing. [name] stated she has been to the pharmacy 3 times in an attempt to fill pt's prescriptions and went to pt's old group home in person yesterday but she has been unable to get pt's prescriptions re-filled and has not be able to get/find pt's Medicaid card; thus, pt has not had his medication in several days. [name] states the old group home states they do not have pt's Medicaid card, so it's unknown,  at this time, where the card is.   [name] stated they brought pt to the ED tonight due to concerns that pt will go through w/ds due to him not being on his medication, one of which is a seizure medication, and they are concerned about his health and well-being. She states pt has given them no problems and that he is well-behaved; they simply are seeking out medication to ensure his needs are being met.  [name] stated her daughter, Miguel Henry, the Manager of [employer], could be contacted with any further information or with any additional questions.  Miguel Henry, Manager, [facility]: [phone]  Principal Problem: <principal problem not specified> Discharge Diagnoses: Active Problems:   * No active hospital problems. *   Past Psychiatric History: Schizoaffective, bipolar type  Past Medical History:  Past Medical History:  Diagnosis Date  . Bipolar 1 disorder ([place])   . HIV (human immunodeficiency virus infection) ([place])   . Schizophrenic disorder ([place])   . Tobacco abuse    No past surgical history on file. Family History: No family history on file. Family Psychiatric  History: No data shared Social History:  Social History   Substance and Sexual Activity  Alcohol Use Yes   Comment: when able to get some     Social History   Substance and Sexual Activity  Drug Use Not Currently  . Types: Marijuana, Cocaine    Social History   Socioeconomic History  . Marital status: Single    Spouse name: Not on file  . Number of children: Not  on file  . Years of education: Not on file  . Highest education level: Not on file  Occupational History  . Not on file  Social Needs  . Financial resource strain: Not on file  . Food insecurity    Worry: Not on file    Inability: Not on file  . Transportation needs    Medical: Not on file    Non-medical: Not on file  Tobacco Use  . Smoking status: Current Every Day Smoker    Types: Cigarettes  . Smokeless tobacco:  Former Network engineer and Sexual Activity  . Alcohol use: Yes    Comment: when able to get some  . Drug use: Not Currently    Types: Marijuana, Cocaine  . Sexual activity: Not on file  Lifestyle  . Physical activity    Days per week: Not on file    Minutes per session: Not on file  . Stress: Not on file  Relationships  . Social Herbalist on phone: Not on file    Gets together: Not on file    Attends religious service: Not on file    Active member of club or organization: Not on file    Attends meetings of clubs or organizations: Not on file    Relationship status: Not on file  Other Topics Concern  . Not on file  Social History Narrative  . Not on file    Hospital Course:    Patient was observed and he displayed no dangerous behaviors he simply needed a medication refill, when I found him in the above state, not an acute nonpsychotic am happy to refill his medications.  Further he does not have the possibility of hyponatremia associated with oxcarbazepine but this is to be monitored.   Musculoskeletal: Strength & Muscle Tone: within normal limits Gait & Station: normal Patient leans: N/A  Psychiatric Specialty Exam: Physical Exam  ROS  Blood pressure 99/62, pulse 85, temperature 98.3 F (36.8 C), temperature source Oral, resp. rate 18, height '5\' 7"'$  (1.702 m), weight 72.6 kg, SpO2 99 %.Body mass index is 25.06 kg/m.  General Appearance: Casual  Eye Contact:  Good  Speech:  Clear and Coherent  Volume:  Decreased  Mood:  Euthymic  Affect:  Restricted  Thought Process:  Coherent, Goal Directed and Descriptions of Associations: Circumstantial  Orientation:  Full (Time, Place, and Person)  Thought Content:  Logical  Suicidal Thoughts:  No  Homicidal Thoughts:  No  Memory:  Immediate;   Fair Recent;   Fair Remote;   Fair  Judgement:  Fair  Insight:  Fair  Psychomotor Activity:  Normal  Concentration:  Concentration: Fair and Attention Span: Fair   Recall:  Poor  Fund of Knowledge:  Good  Language:  Good  Akathisia:  Negative  Handed:  Right  AIMS (if indicated):     Assets:  Communication Skills Desire for Improvement  ADL's:  Intact  Cognition:  WNL  Sleep:           Has this patient used any form of tobacco in the last 30 days? (Cigarettes, Smokeless Tobacco, Cigars, and/or Pipes) Yes, No  Blood Alcohol level:  Lab Results  Component Value Date   ETH <10 02/05/2019   ETH <10 93/90/3009    Metabolic Disorder Labs:  No results found for: HGBA1C, MPG No results found for: PROLACTIN No results found for: CHOL, TRIG, HDL, CHOLHDL, VLDL, LDLCALC  See Psychiatric Specialty Exam and Suicide Risk Assessment  completed by Attending Physician prior to discharge.  Discharge destination:  Home  Is patient on multiple antipsychotic therapies at discharge:  No   Has Patient had three or more failed trials of antipsychotic monotherapy by history:  No  Recommended Plan for Multiple Antipsychotic Therapies: NA   Allergies as of 02/20/2019   No Known Allergies     Medication List    TAKE these medications     Indication  haloperidol 2 MG tablet Commonly known as: HALDOL 1 in am, 2 at h s What changed:   how much to take  how to take this  when to take this  additional instructions  Indication: Psychosis   haloperidol decanoate 100 MG/ML injection Commonly known as: HALDOL DECANOATE Inject 0.5 mLs (50 mg total) into the muscle every 28 (twenty-eight) days. What changed: when to take this  Indication: Schizophrenia   lithium carbonate 450 MG CR tablet Commonly known as: ESKALITH Take 1 tablet (450 mg total) by mouth every 12 (twelve) hours.  Indication: Schizoaffective Disorder   loratadine 10 MG tablet Commonly known as: CLARITIN Take 1 tablet (10 mg total) by mouth daily.  Indication: Perennial Allergic Rhinitis   Melatonin 5 MG Tabs Take 1 tablet (5 mg total) by mouth at bedtime.  Indication:  Trouble Sleeping   Oxcarbazepine 300 MG tablet Commonly known as: TRILEPTAL Take 1 tablet (300 mg total) by mouth 2 (two) times daily.  Indication: Schizoaffective disorder   PARoxetine 10 MG tablet Commonly known as: PAXIL Take 1 tablet (10 mg total) by mouth daily.  Indication: Major Depressive Disorder   QUEtiapine 25 MG tablet Commonly known as: SEROQUEL Take 2 tablets (50 mg total) by mouth at bedtime as needed (agitation).  Indication: Schizophrenia   traZODone 50 MG tablet Commonly known as: DESYREL Take 1 tablet (50 mg total) by mouth at bedtime as needed for sleep.  Indication: Trouble Sleeping       Schizoaffective-stable I have refilled his medications and he is free to be released from the ED as a result.  He is warned about hyponatremia associated with oxcarbazepine therapy.  His long-acting injectable Haldol apparently held him generally asymptomatic while he was without medicines for a few days so that should be continued  SignedJohnn Hai, MD 02/20/2019, 10:04 AM

## 2019-02-20 NOTE — BHH Suicide Risk Assessment (Signed)
[facility] Discharge Suicide Risk Assessment   Principal Problem: Schizoaffective but generally stable just without medications Discharge Diagnoses: Active Problems:   * No active hospital problems. *   Total Time spent with patient: 45 minutes   Mental Status Per Nursing Assessment::   On Admission:    Musculoskeletal: Strength & Muscle Tone: within normal limits Gait & Station: normal Patient leans: N/A  Psychiatric Specialty Exam: Physical Exam  ROS  Blood pressure 99/62, pulse 85, temperature 98.3 F (36.8 C), temperature source Oral, resp. rate 18, height 5\' 7"  (1.702 m), weight 72.6 kg, SpO2 99 %.Body mass index is 25.06 kg/m.  General Appearance: Casual  Eye Contact:  Good  Speech:  Clear and Coherent  Volume:  Decreased  Mood:  Euthymic  Affect:  Restricted  Thought Process:  Coherent, Goal Directed and Descriptions of Associations: Circumstantial  Orientation:  Full (Time, Place, and Person)  Thought Content:  Logical  Suicidal Thoughts:  No  Homicidal Thoughts:  No  Memory:  Immediate;   Fair Recent;   Fair Remote;   Fair  Judgement:  Fair  Insight:  Fair  Psychomotor Activity:  Normal  Concentration:  Concentration: Fair and Attention Span: Fair  Recall:  Poor  Fund of Knowledge:  Good  Language:  Good  Akathisia:  Negative  Handed:  Right  AIMS (if indicated):     Assets:  Communication Skills Desire for Improvement  ADL's:  Intact  Cognition:  WNL  Sleep:        Demographic Factors:  Caucasian  Loss Factors: Not applicable Historical Factors: NA  Risk Reduction Factors:   Sense of responsibility to family and Religious beliefs about death  Continued Clinical Symptoms:  Previous Psychiatric Diagnoses and Treatments  Cognitive Features That Contribute To Risk:  None    Suicide Risk:  Minimal: No identifiable suicidal ideation.  Patients presenting with no risk factors but with morbid ruminations; may be classified as minimal risk based  on the severity of the depressive symptoms    Plan Of Care/Follow-up recommendations:  Activity:  full  Miguel Igoe, MD 02/20/2019, 10:08 AM

## 2019-02-20 NOTE — ED Notes (Signed)
Resumed care from Matt, RN. Orders reviewed; pt resting.  

## 2019-02-20 NOTE — ED Provider Notes (Signed)
-----------------------------------------   6:07 AM on 02/20/2019 -----------------------------------------   Blood pressure (!) 134/93, pulse 90, temperature 98.1 F (36.7 C), temperature source Oral, resp. rate 18, height 5\' 7"  (1.702 m), weight 72.6 kg, SpO2 100 %.  The patient is sleeping at this time.  There have been no acute events since the last update.  Awaiting disposition plan from Behavioral Medicine and/or Social Work team(s).   Paulette Blanch, MD 02/20/19 [phone]

## 2019-02-20 NOTE — ED Notes (Signed)
Patient placed in cab [facility] to [facility]

## 2019-02-20 NOTE — ED Notes (Addendum)
Pt requests to know when he will be leaving. Explained that we are working on this and that I will inform him as things change. Pt requests to go outside and smoke; this nurse explained that we can't do that but I will request nicotine patch from MD>.

## 2019-02-20 NOTE — BH Assessment (Addendum)
TTS assisted pt's nurse Tomasa Hosteller, RN) with contacting pt's legal guardian Ricci Barker: [phone] ... Isaias Sakai: [phone]) to inform them that patient has been recommended for discharge. This Probation officer spoke directly to [employer] who gave the verbal consent for the patient to be transported via taxi cab.  [name] ([employer] owner) was contacted at [phone] and she asked this writer if patient was being discharged with prescriptions. This Probation officer informed her that the ED Psychiatrist - Dr. Sheppard Evens wrote prescriptions for the pt's home medications. She expressed her understanding and gratitude while informing this Probation officer that one of the group home staff members will cover the cost of the cab.  This information was relayed to pt's nurse - Jeanett Schlein, RN and EDP - Dr. Charna Archer.

## 2019-02-20 NOTE — ED Notes (Signed)
Cab in front for patient.

## 2019-02-20 NOTE — ED Provider Notes (Signed)
-----------------------------------------   11:10 AM on 02/20/2019 -----------------------------------------  Blood pressure 99/62, pulse 85, temperature 98.3 F (36.8 C), temperature source Oral, resp. rate 18, height 5\' 7"  (1.702 m), weight 72.6 kg, SpO2 99 %.  The patient is calm and cooperative at this time.  There have been no acute events since the last update.  Patient evaluated by psychiatry and deemed to be appropriate for discharge back to group home as he does not represent a threat to himself or others.  Psychiatry has refilled his regular medications.   Blake Divine, MD 02/20/19 [phone]

## 2019-02-20 NOTE — ED Notes (Signed)
Cab called ETA 1 hour

## 2019-08-19 ENCOUNTER — Emergency Department: Payer: Medicare Other

## 2019-08-19 ENCOUNTER — Other Ambulatory Visit: Payer: Self-pay

## 2019-08-19 ENCOUNTER — Telehealth: Payer: Self-pay | Admitting: Licensed Clinical Social Worker

## 2019-08-19 ENCOUNTER — Emergency Department
Admission: EM | Admit: 2019-08-19 | Discharge: 2019-08-19 | Payer: Medicare Other | Attending: Emergency Medicine | Admitting: Emergency Medicine

## 2019-08-19 DIAGNOSIS — Z21 Asymptomatic human immunodeficiency virus [HIV] infection status: Secondary | ICD-10-CM | POA: Insufficient documentation

## 2019-08-19 DIAGNOSIS — F17210 Nicotine dependence, cigarettes, uncomplicated: Secondary | ICD-10-CM | POA: Insufficient documentation

## 2019-08-19 DIAGNOSIS — Z598 Other problems related to housing and economic circumstances: Secondary | ICD-10-CM

## 2019-08-19 DIAGNOSIS — Z593 Problems related to living in residential institution: Secondary | ICD-10-CM | POA: Diagnosis not present

## 2019-08-19 DIAGNOSIS — Z751 Person awaiting admission to adequate facility elsewhere: Secondary | ICD-10-CM | POA: Diagnosis not present

## 2019-08-19 DIAGNOSIS — F71 Moderate intellectual disabilities: Secondary | ICD-10-CM | POA: Diagnosis not present

## 2019-08-19 DIAGNOSIS — Z20822 Contact with and (suspected) exposure to covid-19: Secondary | ICD-10-CM | POA: Insufficient documentation

## 2019-08-19 DIAGNOSIS — Z5989 Other problems related to housing and economic circumstances: Secondary | ICD-10-CM

## 2019-08-19 DIAGNOSIS — F319 Bipolar disorder, unspecified: Secondary | ICD-10-CM | POA: Diagnosis not present

## 2019-08-19 DIAGNOSIS — Z79899 Other long term (current) drug therapy: Secondary | ICD-10-CM | POA: Diagnosis not present

## 2019-08-19 LAB — RESPIRATORY PANEL BY RT PCR (FLU A&B, COVID)
Influenza A by PCR: NEGATIVE
Influenza B by PCR: NEGATIVE
SARS Coronavirus 2 by RT PCR: NEGATIVE

## 2019-08-19 NOTE — ED Notes (Signed)
Urine sent to lab at this time.
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
## 2019-08-19 NOTE — ED Provider Notes (Signed)
-----------------------------------------   4:25 PM on 08/19/2019 -----------------------------------------  Social work has been able to find the patient a new living arrangement.  Patient's Covid and chest x-ray are negative.  Patient will likely be transported after 6 PM.   Minna Antis, MD 08/19/19 1626

## 2019-08-19 NOTE — ED Notes (Signed)
Assessment completed  He denies pain  ambulates with a walker  NAD assessed   Social work consult ordered by MD at [employer]
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
## 2019-08-19 NOTE — NC FL2 (Addendum)
[place] MEDICAID FL2 LEVEL OF CARE SCREENING TOOL     IDENTIFICATION  Patient Name: Miguel Henry Birthdate: [birth year] Sex: male Admission Date (Current Location): 08/19/2019  [place] and [place] Number:  [address] [id number] Facility and Address:  [facility], [address], [place], [place] [address]      Provider Number: [id number]  Attending Physician Name and Address:  Phineas Semen, MD  Relative Name and Phone Number:  Jyl Heinz [phone] [facility] - Legal Guardian    Current Level of Care: Other (Comment)(Group Home) Recommended Level of Care: [facility] Prior Approval Number:    Date Approved/Denied:   PASRR Number: [id number]  Discharge Plan: Other (Comment)(Group Home)    Current Diagnoses: Patient Active Problem List   Diagnosis Date Noted  . 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 Schizoaffective disorder (HCC) 09/11/2018  . Suicidal ideation     Orientation RESPIRATION BLADDER Height & Weight     Self  Normal Incontinent Weight: 140 lb (63.5 kg) Height:  5\' 9"  (175.3 cm)  BEHAVIORAL SYMPTOMS/MOOD NEUROLOGICAL BOWEL NUTRITION STATUS  Other (Comment)(Patient has cognitive decline)   Incontinent Diet  AMBULATORY STATUS COMMUNICATION OF NEEDS Skin   Supervision Does not communicate Normal                       Personal Care Assistance Level of Assistance  Bathing, Dressing, Feeding Bathing Assistance: Limited assistance Feeding assistance: Limited assistance Dressing Assistance: Limited assistance     Functional Limitations Info             SPECIAL CARE FACTORS FREQUENCY                       Contractures Contractures Info: Not present    Additional Factors Info                  Current Medications (08/19/2019):  This is the current hospital active medication list No current  facility-administered medications for this encounter.   Current Outpatient Medications  Medication Sig Dispense Refill  . haloperidol (HALDOL) 2 MG tablet 1 in am, 2 at h s 90 tablet 3  . haloperidol decanoate (HALDOL DECANOATE) 100 MG/ML injection Inject 0.5 mLs (50 mg total) into the muscle every 28 (twenty-eight) days. 1 mL 3  . lithium carbonate (ESKALITH) 450 MG CR tablet Take 1 tablet (450 mg total) by mouth every 12 (twelve) hours. 60 tablet 2  . loratadine (CLARITIN) 10 MG tablet Take 1 tablet (10 mg total) by mouth daily. 30 tablet 2  . Melatonin 5 MG TABS Take 1 tablet (5 mg total) by mouth at bedtime. 90 tablet 2  . Oxcarbazepine (TRILEPTAL) 300 MG tablet Take 1 tablet (300 mg total) by mouth 2 (two) times daily. 60 tablet 3  . PARoxetine (PAXIL) 10 MG tablet Take 1 tablet (10 mg total) by mouth daily. 30 tablet 3  . QUEtiapine (SEROQUEL) 25 MG tablet Take 2 tablets (50 mg total) by mouth at bedtime as needed (agitation). 30 tablet 2  . traZODone (DESYREL) 50 MG tablet Take 1 tablet (50 mg total) by mouth at bedtime as needed for sleep. 30 tablet 2     Discharge Medications: Please see discharge summary for a list of discharge medications.  Relevant Imaging Results:  Relevant Lab Results:   Additional Information SS#[id number]  07-28-1981, LCSWA

## 2019-08-19 NOTE — TOC Transition Note (Addendum)
Transition of Care [facility]) - CM/SW Discharge Note   Patient Details  Name: Miguel Henry MRN: [id number] Date of Birth: May 06, 1970  Transition of Care [facility] CM/SW Contact:  Diamond Bar Cellar, RN Phone Number: 08/19/2019, 3:47 PM   Clinical Narrative:    Left voicemail for legal guardian [name] w/ [facility] [phone] updating that patient was discharging to [facility] group home today under the care of Jethro Bolus. Confirmed with Dorothea Rogers-patient to be picked up at 6:00pm by group home.    Final next level of care: Group Home Barriers to Discharge: No Barriers Identified   Patient Goals and CMS Choice Patient states their goals for this hospitalization and ongoing recovery are:: APS/DSS emergency placement due to unsafe conditions. CMS Medicare.gov Compare Post Acute Care list provided to:: Other (Comment Required)(Patient has open APS/DSS case, [name], MSW is case worker) Choice offered to / list presented to : NA  Discharge Placement                       Discharge Plan and Services In-house Referral: Clinical Social Work                                   Social Determinants of Health (SDOH) Interventions     Readmission Risk Interventions No flowsheet data found.

## 2019-08-19 NOTE — ED Provider Notes (Signed)
[facility] Emergency Department Provider Note   ____________________________________________   I have reviewed the triage vital signs and the nursing notes.   HISTORY  Chief Complaint None  History limited by: Not Limited   HPI Miguel Henry is a 49 y.o. male who presents to the emergency department today via EMS.  It is slightly unclear why the patient was brought to the emergency department.  Apparently the patient was at a group home and his legal guardian wanted him to go to a different group home.  It sounds like he called EMS for him to be brought to the emergency department until another group home could be arranged.  The patient himself has no medical complaints.   Records reviewed. Per medical record review patient has a history of bipolar, HIV  Past Medical History:  Diagnosis Date  . Bipolar 1 disorder (HCC)   . HIV (human immunodeficiency virus infection) (HCC)   . Schizophrenic disorder (HCC)   . Tobacco abuse     Patient Active Problem List   Diagnosis Date Noted  . 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 Schizoaffective disorder (HCC) 09/11/2018  . Suicidal ideation     History reviewed. No pertinent surgical history.  Prior to Admission medications   Medication Sig Start Date End Date Taking? Authorizing Provider  haloperidol (HALDOL) 2 MG tablet 1 in am, 2 at h s 02/20/19   Malvin Johns, MD  haloperidol decanoate (HALDOL DECANOATE) 100 MG/ML injection Inject 0.5 mLs (50 mg total) into the muscle every 28 (twenty-eight) days. 02/20/19   Malvin Johns, MD  lithium carbonate (ESKALITH) 450 MG CR tablet Take 1 tablet (450 mg total) by mouth every 12 (twelve) hours. 02/20/19   Malvin Johns, MD  loratadine (CLARITIN) 10 MG tablet Take 1 tablet (10 mg total) by mouth daily. 02/20/19   Malvin Johns, MD  Melatonin 5 MG TABS Take 1 tablet (5 mg total) by  mouth at bedtime. 02/20/19   Malvin Johns, MD  Oxcarbazepine (TRILEPTAL) 300 MG tablet Take 1 tablet (300 mg total) by mouth 2 (two) times daily. 02/20/19   Malvin Johns, MD  PARoxetine (PAXIL) 10 MG tablet Take 1 tablet (10 mg total) by mouth daily. 02/20/19   Malvin Johns, MD  QUEtiapine (SEROQUEL) 25 MG tablet Take 2 tablets (50 mg total) by mouth at bedtime as needed (agitation). 02/20/19   Malvin Johns, MD  traZODone (DESYREL) 50 MG tablet Take 1 tablet (50 mg total) by mouth at bedtime as needed for sleep. 02/20/19   Malvin Johns, MD    Allergies Patient has no known allergies.  History reviewed. No pertinent family history.  Social History Social History   Tobacco Use  . Smoking status: Current Every Day Smoker    Types: Cigarettes  . Smokeless tobacco: Former Engineer, water Use Topics  . Alcohol use: Yes    Comment: when able to get some  . Drug use: Not Currently    Types: Marijuana, Cocaine    Review of Systems Constitutional: No fever/chills Eyes: No visual changes. ENT: No sore throat. Cardiovascular: Denies chest pain. Respiratory: Denies shortness of breath. Gastrointestinal: No abdominal pain.  No nausea, no vomiting.  No diarrhea.   Genitourinary: Negative for dysuria. Musculoskeletal: Negative for back pain. Skin: Negative for rash. Neurological: Negative for headaches, focal weakness or numbness.  ____________________________________________   PHYSICAL EXAM:  VITAL SIGNS: ED Triage Vitals  Enc Vitals Group  BP 08/19/19 1240 122/79     Pulse Rate 08/19/19 1240 (!) 52     Resp 08/19/19 1240 16     Temp 08/19/19 1240 98.3 F (36.8 C)     Temp Source 08/19/19 1240 Oral     SpO2 08/19/19 1240 99 %     Weight 08/19/19 1241 140 lb (63.5 kg)     Height 08/19/19 1241 5\' 9"  (1.753 m)     Head Circumference --      Peak Flow --      Pain Score 08/19/19 1240 0   Constitutional: Alert and oriented.  Eyes: Conjunctivae are normal.  ENT      Head:  Normocephalic and atraumatic.      Nose: No congestion/rhinnorhea.      Mouth/Throat: Mucous membranes are moist.      Neck: No stridor. Hematological/Lymphatic/Immunilogical: No cervical lymphadenopathy. Cardiovascular: Normal rate, regular rhythm.  No murmurs, rubs, or gallops.  Respiratory: Normal respiratory effort without tachypnea nor retractions. Breath sounds are clear and equal bilaterally. No wheezes/rales/rhonchi. Gastrointestinal: Soft and non tender. No rebound. No guarding.  Genitourinary: Deferred Musculoskeletal: Normal range of motion in all extremities. No lower extremity edema. Neurologic:  Normal speech and language. No gross focal neurologic deficits are appreciated.  Skin:  Skin is warm, dry and intact. No rash noted. Psychiatric: Mood and affect are normal. Speech and behavior are normal. Patient exhibits appropriate insight and judgment. Does not appear to be responding to internal stimuli.   ____________________________________________    LABS (pertinent positives/negatives)  None  ____________________________________________   EKG  None  ____________________________________________    RADIOLOGY  None  ____________________________________________   PROCEDURES  Procedures  ____________________________________________   INITIAL IMPRESSION / ASSESSMENT AND PLAN / ED COURSE  Pertinent labs & imaging results that were available during my care of the patient were reviewed by me and considered in my medical decision making (see chart for details).   Patient presented to the emergency department today for apparent group home placement.  Patient denies any medical complaints.  Will have social work evaluate.  ___________________________________________   FINAL CLINICAL IMPRESSION(S) / ED DIAGNOSES  Final diagnoses:  Living accommodation issues     Note: This dictation was prepared with Dragon dictation. Any transcriptional errors that  result from this process are unintentional     Nance Pear, MD 08/19/19 1249

## 2019-08-19 NOTE — TOC Progression Note (Addendum)
Transition of Care [facility]) - Progression Note    Patient Details  Name: Talin Feister MRN: [id number] Date of Birth: April 02, 1971  Transition [facility]) CM/SW Contact  Ruma Cellar, RN Phone Number: 08/19/2019, 3:07 PM  Clinical Narrative:    Sherron Monday to Cyndy Freeze, MSW [facility] APS/DSS [phone]. Updated that patient was being placed with [facility] [phone] and Guardian was aware and had given verbal consent. Group Home will need Fl2, TB (chest xray) and rapid COVID and can admit once completed. Roni requests that guardian be notified prior to discharge.   TOC RN CM spoke to [facility] from group home who requested chest xray, updated Fl2 and rapid COVID prior to discharge. States group home will pick patient up today once requests are completed. TOC RN CM reviewed medications with Apolinar Junes and will confirm FL2 has any updated medications.  TOC RN CM updated ED RN and EDP. Faxed updated list of medications to [phone] at [facility] request. Spoke to [place] @ [facility] who confirmed patient and medication list received.   Expected Discharge Plan: Group Home Barriers to Discharge: No Barriers Identified  Expected Discharge Plan and Services Expected Discharge Plan: Group Home In-house Referral: Clinical Social Work     Living arrangements for the past 2 months: Group Home                                       Social Determinants of Health (SDOH) Interventions    Readmission Risk Interventions No flowsheet data found.
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
## 2019-08-19 NOTE — TOC Progression Note (Signed)
Transition of Care [facility]) - Progression Note    Patient Details  Name: Emerson Schreifels MRN: [id number] Date of Birth: Mar 02, 1971  Transition of Care [facility]) CM/SW Contact  Marina Goodell Phone Number: [phone] 08/19/2019, 2:50 PM  Clinical Narrative:     Left voicemail for Cyndy Freeze, MSW [facility] APS/DSS [phone], for update status for patient placement.  Expected Discharge Plan: Group Home Barriers to Discharge: No Barriers Identified  Expected Discharge Plan and Services Expected Discharge Plan: Group Home In-house Referral: Clinical Social Work     Living arrangements for the past 2 months: Group Home                                       Social Determinants of Health (SDOH) Interventions    Readmission Risk Interventions No flowsheet data found.

## 2020-02-25 ENCOUNTER — Encounter: Payer: Self-pay | Admitting: Emergency Medicine

## 2020-02-25 ENCOUNTER — Emergency Department: Payer: Medicare Other

## 2020-02-25 ENCOUNTER — Other Ambulatory Visit: Payer: Self-pay

## 2020-02-25 ENCOUNTER — Emergency Department
Admission: EM | Admit: 2020-02-25 | Discharge: 2020-02-25 | Disposition: A | Discharge status: Home / Self Care | Payer: Medicare Other | Attending: Emergency Medicine | Admitting: Emergency Medicine

## 2020-02-25 DIAGNOSIS — F17210 Nicotine dependence, cigarettes, uncomplicated: Secondary | ICD-10-CM | POA: Insufficient documentation

## 2020-02-25 DIAGNOSIS — Y9289 Other specified places as the place of occurrence of the external cause: Secondary | ICD-10-CM | POA: Diagnosis not present

## 2020-02-25 DIAGNOSIS — S0990XA Unspecified injury of head, initial encounter: Secondary | ICD-10-CM | POA: Insufficient documentation

## 2020-02-25 DIAGNOSIS — W19XXXA Unspecified fall, initial encounter: Secondary | ICD-10-CM | POA: Diagnosis not present

## 2020-02-25 DIAGNOSIS — S0083XA Contusion of other part of head, initial encounter: Secondary | ICD-10-CM | POA: Insufficient documentation

## 2020-02-25 MED ORDER — BACITRACIN-NEOMYCIN-POLYMYXIN 400-5-5000 EX OINT
TOPICAL_OINTMENT | Freq: Once | CUTANEOUS | Status: AC
  Administered 2020-02-25: 1 via TOPICAL
  Filled 2020-02-25: qty 1

## 2020-02-25 NOTE — ED Notes (Signed)
Called [facility] for transport to [facility]  [phone]

## 2020-02-25 NOTE — ED Notes (Signed)
[facility] notified of discharge in place, care home to call back regarding transportation.

## 2020-02-25 NOTE — Discharge Instructions (Addendum)
No acute findings on CT of the head.  Clearly return back to [facility] home [facility].  Continue previous medications.

## 2020-02-25 NOTE — ED Notes (Signed)
Pt getting OOB x 2 despite redirection. Pt states he is going home with his parents, Pt placed back in bed, nonskid socks applied, door open for best visual access, bed alarm on. Pt given remote and TV turned on to help pt stay in bed.

## 2020-02-25 NOTE — ED Provider Notes (Signed)
[facility] Emergency Department Provider Note   ____________________________________________   First MD Initiated Contact with Patient 02/25/20 1017     (approximate)  I have reviewed the triage vital signs and the nursing notes.   HISTORY  Chief Complaint Fall    HPI Miguel Henry is a 49 y.o. male patient arrived via EMS from [facility] home. Patient was found laying on the floor this morning. Patient has hematoma above the right eyebrow. There is dried blood. Patient state his legs gave out. However further evaluation and conversation reveals the patient mental status is not conclusive to get get a good history. Attendant at the nursing home states that when found patient was sleeping on the floor but was easily aroused. They voiced concern about him leaning to one side when sitting in a chair.      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 No pertinent surgical history.  Prior to Admission medications   Medication Sig Start Date End Date Taking? Authorizing Provider  haloperidol (HALDOL) 2 MG tablet 1 in am, 2 at h s 02/20/19   Malvin Johns, MD  haloperidol decanoate (HALDOL DECANOATE) 100 MG/ML injection Inject 0.5 mLs (50 mg total) into the muscle every 28 (twenty-eight) days. 02/20/19   Malvin Johns, MD  lithium carbonate (ESKALITH) 450 MG CR tablet Take 1 tablet (450 mg total) by mouth every 12 (twelve) hours. 02/20/19   Malvin Johns, MD  loratadine (CLARITIN) 10 MG tablet Take 1 tablet (10 mg total) by mouth daily. 02/20/19   Malvin Johns, MD  Melatonin 5 MG TABS Take 1 tablet (5 mg total) by mouth at bedtime. 02/20/19   Malvin Johns, MD  Oxcarbazepine (TRILEPTAL) 300 MG tablet Take 1 tablet (300 mg total) by mouth 2 (two) times daily. 02/20/19   Malvin Johns, MD  PARoxetine (PAXIL) 10 MG tablet Take 1 tablet (10 mg total) by mouth daily. 02/20/19   Malvin Johns, MD  QUEtiapine (SEROQUEL) 25 MG tablet Take 2 tablets (50 mg total) by mouth at bedtime as needed (agitation). 02/20/19   Malvin Johns, MD  traZODone (DESYREL) 50 MG tablet Take 1 tablet (50 mg total) by mouth at bedtime as needed for sleep. 02/20/19   Malvin Johns, MD    Allergies Patient has no known allergies.  History reviewed. No pertinent family history.  Social History Social History   Tobacco Use   Smoking status: Current Every Day Smoker    Types: Cigarettes   Smokeless tobacco: Former Neurosurgeon  Substance Use Topics   Alcohol use: Yes    Comment: when able to get some   Drug use: Not Currently    Types: Marijuana, Cocaine    Review of Systems  Constitutional: No fever/chills Eyes: No visual changes. ENT: No sore throat. Cardiovascular: Denies chest pain. Respiratory: Denies shortness of breath. Gastrointestinal: No abdominal pain.  No nausea, no vomiting.  No diarrhea.  No constipation. Genitourinary: Negative for dysuria. Musculoskeletal: Negative for back pain. Skin: Negative for rash. Neurological: Negative for headaches, focal weakness or numbness. Psychiatric:  Bipolar, adjustment disorder,  schizoaffective, and more intellectual disability. Allergic/Immunilogical: HIV. ____________________________________________   PHYSICAL EXAM:  VITAL SIGNS: ED Triage Vitals  Enc Vitals Group     BP 02/25/20 1026 119/83     Pulse Rate 02/25/20 1026 97     Resp 02/25/20 1026 18     Temp 02/25/20 1026 98.9 F (37.2 C)     Temp Source 02/25/20 1026 Oral     SpO2 02/25/20 1026 99 %     Weight 02/25/20 1027 160 lb (72.6 kg)     Height  02/25/20 1027 5\' 7"  (1.702 m)     Head Circumference --      Peak Flow --      Pain Score 02/25/20 1027 0     Pain Loc --      Pain Edu? --      Excl. in GC? --    Constitutional: Alert and oriented. Well appearing and in no acute distress. Eyes: Conjunctivae are normal. PERRL. EOMI. Head: Atraumatic. Nose: No congestion/rhinnorhea. Mouth/Throat: Mucous membranes are moist.  Oropharynx non-erythematous. Neck: No stridor.  No cervical spine tenderness to palpation. Hematological/Lymphatic/Immunilogical: No cervical lymphadenopathy. Cardiovascular: Normal rate, regular rhythm. Grossly normal heart sounds.  Good peripheral circulation. Respiratory: Normal respiratory effort.  No retractions. Lungs CTAB. Gastrointestinal: Soft and nontender. No distention. No abdominal bruits. No CVA tenderness. Genitourinary: Deferred Musculoskeletal: No lower extremity tenderness nor edema.  No joint effusions. Neurologic:  Normal speech and language. No gross focal neurologic deficits are appreciated. No gait instability. Skin: Laceration right supraorbital area.  No rash noted.  ____________________________________________   LABS (all labs ordered are listed, but only abnormal results are displayed)  Labs Reviewed - No data to display ____________________________________________  EKG   ____________________________________________  RADIOLOGY I, 13/06/21, personally viewed and evaluated these images (plain radiographs) as part of my medical decision making, as well as reviewing the written report by the radiologist.  ED MD interpretation:    Official radiology report(s): CT Head Wo Contrast  Result Date: 02/25/2020 CLINICAL DATA:  Legs gave out from under him, fall, facial trauma EXAM: CT HEAD WITHOUT CONTRAST TECHNIQUE: Contiguous axial images were obtained from the base of the skull through the vertex without intravenous contrast. Sagittal and coronal MPR images reconstructed from  axial data set. COMPARISON:  None FINDINGS: Brain: Generalized atrophy. Normal ventricular morphology. No midline shift or mass effect. Minimal small vessel chronic ischemic changes of deep cerebral white matter. 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 No intracranial hemorrhage, mass lesion, or evidence of acute infarction. No extra-axial fluid collections. Tiny old cortical infarct at a gyrus at the LEFT vertex. No intracranial hemorrhage, Vascular: No hyperdense vessels Skull: Intact Sinuses/Orbits: Mucosal thickening throughout ethmoid air cells, sphenoid sinus, maxillary sinus, minimally frontal sinus. Other: N/A IMPRESSION: Atrophy with minimal small vessel chronic ischemic changes of deep cerebral white matter. 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 No acute intracranial abnormalities. Electronically Signed   By: 13/09/2019 M.D.   On: 02/25/2020 11:52    ____________________________________________   PROCEDURES  Procedure(s) performed (including Critical Care):  Procedures   ____________________________________________   INITIAL IMPRESSION / ASSESSMENT AND PLAN / ED COURSE  As part of my medical decision making, I reviewed the following data within the electronic MEDICAL RECORD NUMBER         Patient arrived via EMS secondary to unwitnessed fall.  Attendance at the home care state they found him on the floor this morning with dried blood around the  right supraorbital area.  Patient mental status is compromised by being bipolar and schizophrenic.  No acute findings on CT of the head.  Elected to just clean the wound and applied dressing.      ____________________________________________   FINAL CLINICAL IMPRESSION(S) / ED DIAGNOSES  Final diagnoses:  Minor head injury, initial encounter     ED Discharge Orders    None      *Please note:  Miguel Henry was evaluated in Emergency Department on 02/25/2020 for the symptoms described in the history of  present illness. He was evaluated in the context of the global COVID-19 pandemic, which necessitated consideration that the patient might be at risk for infection with the SARS-CoV-2 virus that causes COVID-19. Institutional protocols and algorithms that pertain to the evaluation of patients at risk for COVID-19 are in a state of rapid change based on information released by regulatory bodies including the CDC and federal and state organizations. These policies and algorithms were followed during the patient's care in the ED.  Some ED evaluations and interventions may be delayed as a result of limited staffing during and the pandemic.*   Note:  This document was prepared using Dragon voice recognition software and may include unintentional dictation errors.    Joni Reining, PA-C 02/25/20 1211    Jene Every, MD 02/25/20 [phone]

## 2020-02-25 NOTE — ED Notes (Signed)
Legal guardian notified, Maryann Alar, on call for [facility]. States she will notify pt's regular legal guardian as well.

## 2020-02-25 NOTE — ED Triage Notes (Signed)
PT to ER via EMS from [facility] with c/o his legs giving out from under him and falling. Pt denies injuries, per EMS he just wanted to be evaluated.  Pt unable to voice concerns for visit at this time.  NAD noted, VSS, discussed same with providers.

## 2020-02-25 NOTE — ED Notes (Addendum)
Contacted caregiver at care home who states pt would need to return to them by Taxi.  As previously reported by same care worker, this pt has periods of confusion.  Pt came to ER for frequent falls.  In this RNs judgement this pt is not safe to transport in a Taxi.  EMS called for transport home.

## 2021-05-20 IMAGING — CT CT HEAD W/O CM
3 series · 15 of 47 positions shown, 18 images · non-contrast
Comparison: None

CLINICAL DATA: Legs gave out from under him, fall, facial trauma

EXAM:
CT HEAD WITHOUT CONTRAST
TECHNIQUE: Contiguous axial images were obtained from the base of the skull
through the vertex without intravenous contrast. Sagittal and
coronal MPR images reconstructed from axial data set.

[Series 3: head wo · axial · 0.44mm/px · z∈[-135,+5]mm · 9 of 34 slices shown, 12 images]
[im 3/34  brain]
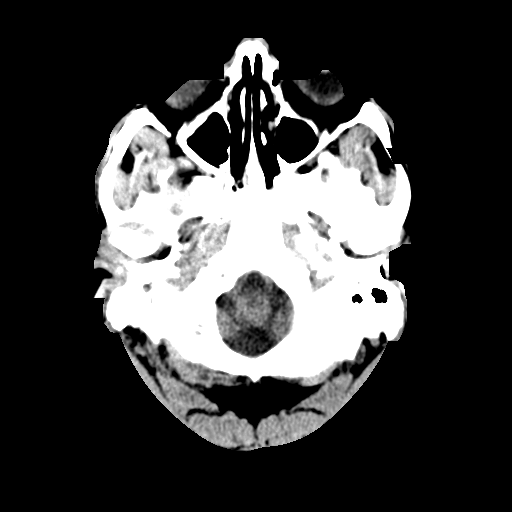
[im 3/34  bone]
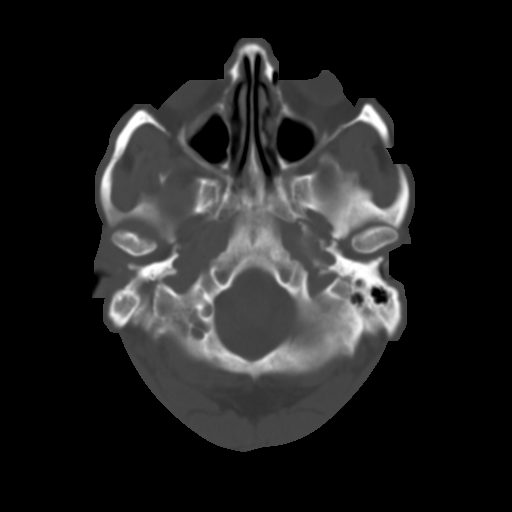
[im 6/34  brain]
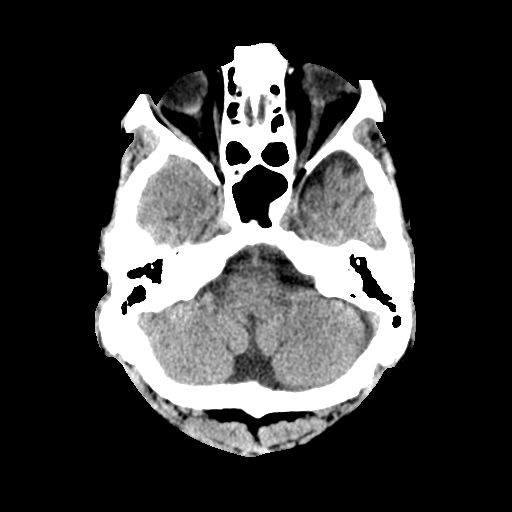
[im 10/34  brain]
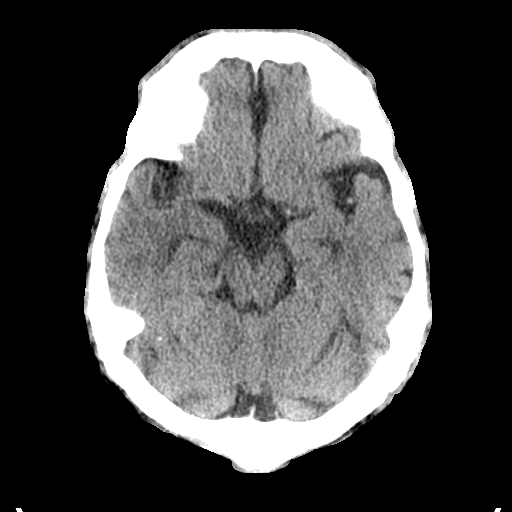
[im 13/34  brain]
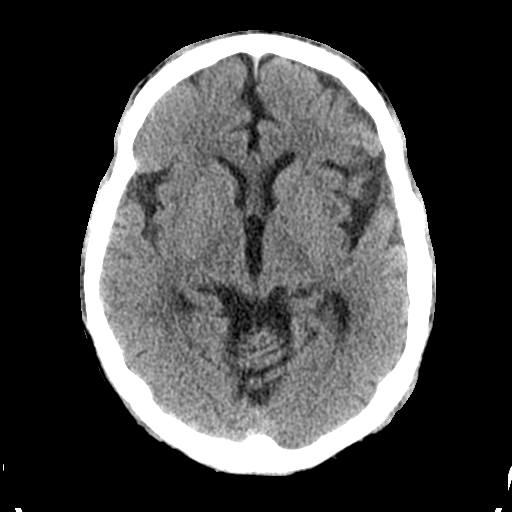
[im 18/34  brain]
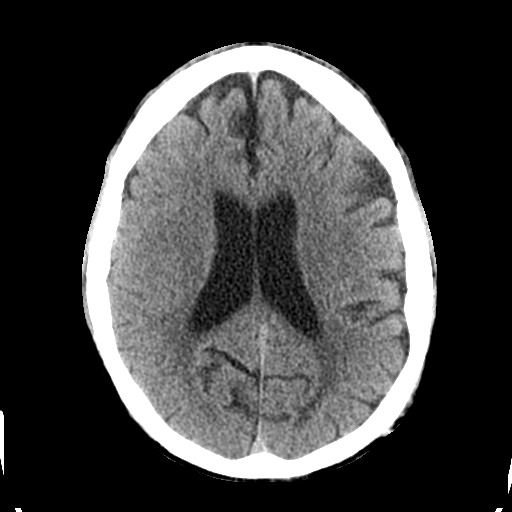
[im 18/34  bone]
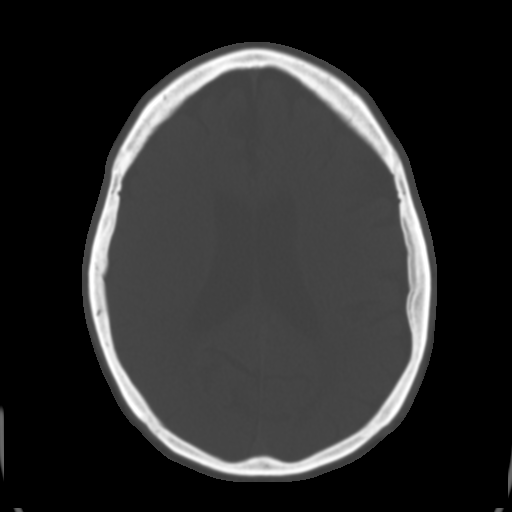
[im 21/34  brain]
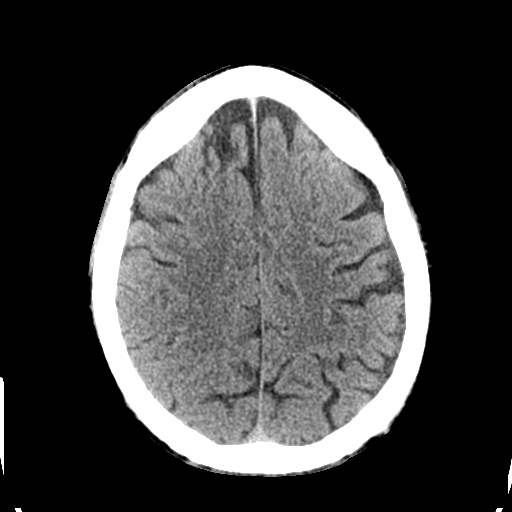
[im 24/34  brain]
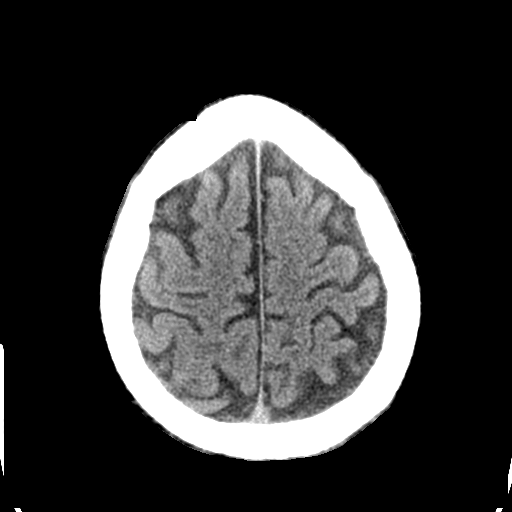
[im 28/34  brain]
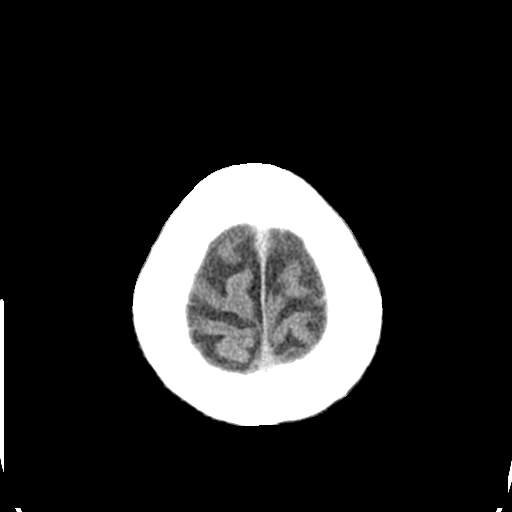
[im 31/34  brain]
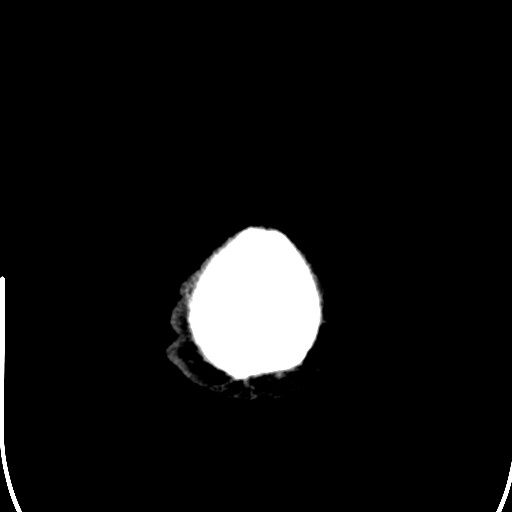
[im 31/34  bone]
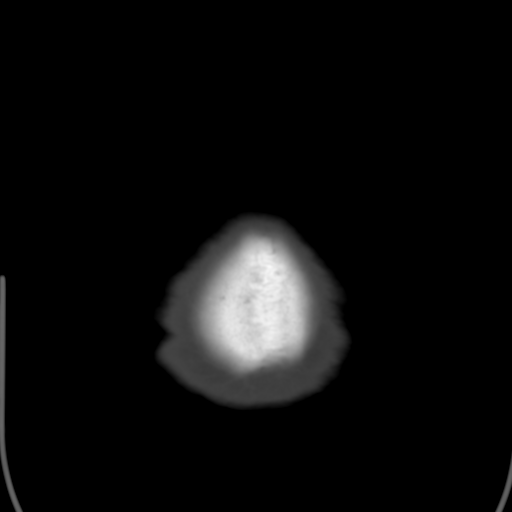

[Series 4: coronal soft tissue · coronal · 0.33mm/px · 3 of 66 slices shown]
[im 22/66  brain]
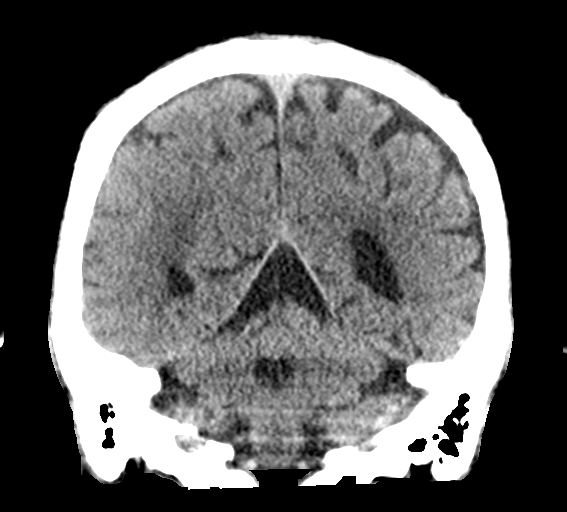
[im 29/66  brain]
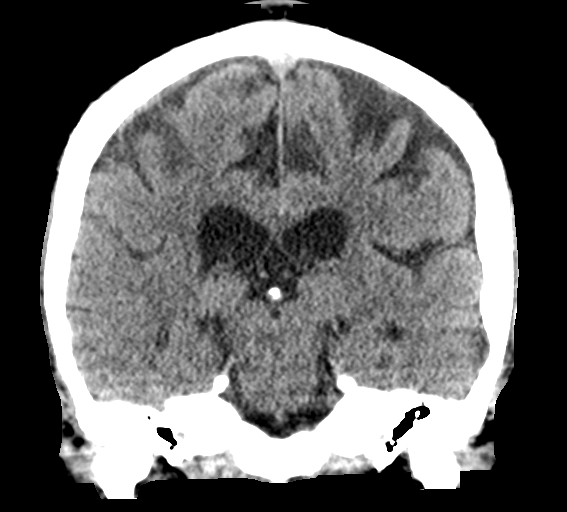
[im 37/66  brain]
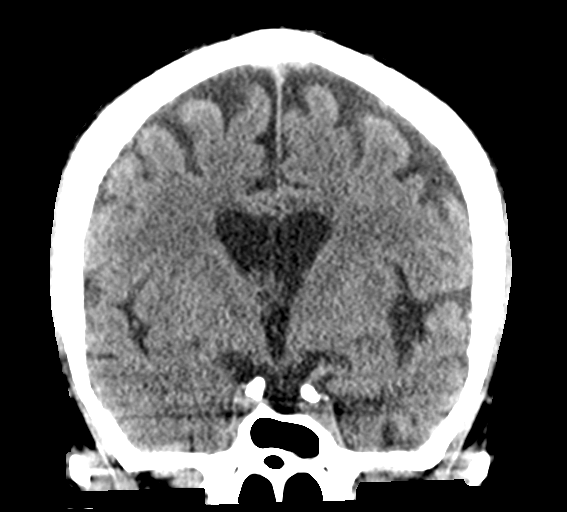

[Series 5: sagittal soft tissue · sagittal · 0.32mm/px · 3 of 56 slices shown]
[im 19/56  brain]
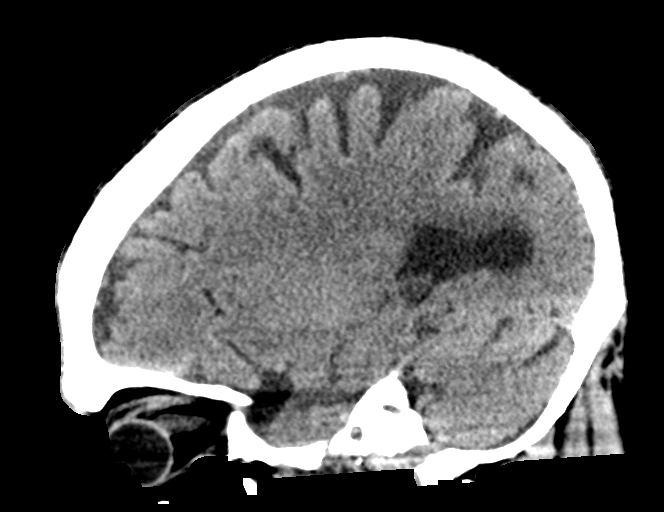
[im 28/56  brain]
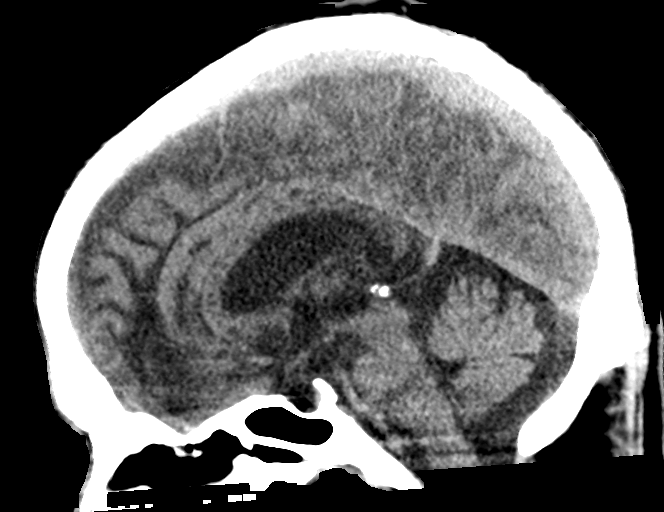
[im 37/56  brain]
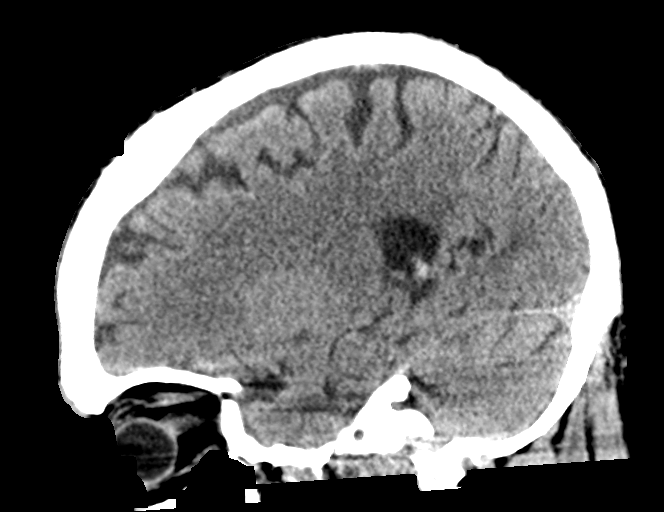

[15 of 47 positions shown; findings below may reference images not displayed]

FINDINGS: Brain: Generalized atrophy. Normal ventricular morphology. No
midline shift or mass effect. Minimal small vessel chronic ischemic
changes of deep cerebral white matter. Small old RIGHT frontal
infarct. No intracranial hemorrhage, mass lesion, or evidence of
acute infarction. No extra-axial fluid collections. Tiny old
cortical infarct at a gyrus at the LEFT vertex. No intracranial
hemorrhage,

Vascular: No hyperdense vessels

Skull: Intact

Sinuses/Orbits: Mucosal thickening throughout ethmoid air cells,
sphenoid sinus, maxillary sinus, minimally frontal sinus.

Other: N/A
IMPRESSION: Atrophy with minimal small vessel chronic ischemic changes of deep
cerebral white matter.

Small old RIGHT frontal and tiny LEFT vertex cortical infarcts.

No acute intracranial abnormalities.

## 2022-01-16 NOTE — Telephone Encounter (Signed)
Encounter
